# Patient Record
Sex: Male | Born: 1980 | Race: White | Hispanic: No | Marital: Single | Smoking: Current every day smoker
Health system: Southern US, Community
[De-identification: ages and names within clinical notes are randomized; demographics above are authoritative.]

## PROBLEM LIST (undated history)

## (undated) DIAGNOSIS — F4310 Post-traumatic stress disorder, unspecified: Secondary | ICD-10-CM

## (undated) DIAGNOSIS — I10 Essential (primary) hypertension: Secondary | ICD-10-CM

## (undated) DIAGNOSIS — F319 Bipolar disorder, unspecified: Secondary | ICD-10-CM

## (undated) HISTORY — PX: APPENDECTOMY: SHX54

## (undated) HISTORY — PX: HEEL SPUR SURGERY: SHX665

## (undated) HISTORY — PX: FOOT SURGERY: SHX648

---
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
## 2002-01-03 ENCOUNTER — Encounter: Payer: Self-pay | Admitting: Emergency Medicine

## 2002-01-03 ENCOUNTER — Emergency Department (HOSPITAL_COMMUNITY): Admission: EM | Admit: 2002-01-03 | Discharge: 2002-01-03 | Payer: Self-pay | Admitting: Emergency Medicine

## 2002-01-19 ENCOUNTER — Emergency Department (HOSPITAL_COMMUNITY): Admission: EM | Admit: 2002-01-19 | Discharge: 2002-01-19 | Payer: Self-pay | Admitting: Emergency Medicine

## 2002-01-19 ENCOUNTER — Encounter: Payer: Self-pay | Admitting: Emergency Medicine

## 2002-02-17 ENCOUNTER — Inpatient Hospital Stay (HOSPITAL_COMMUNITY): Admission: EM | Admit: 2002-02-17 | Discharge: 2002-02-21 | Payer: Self-pay | Admitting: Psychiatry

## 2005-03-05 ENCOUNTER — Emergency Department (HOSPITAL_COMMUNITY): Admission: EM | Admit: 2005-03-05 | Discharge: 2005-03-05 | Payer: Self-pay | Admitting: Emergency Medicine

## 2005-10-21 ENCOUNTER — Emergency Department (HOSPITAL_COMMUNITY): Admission: EM | Admit: 2005-10-21 | Discharge: 2005-10-21 | Payer: Self-pay | Admitting: Emergency Medicine

## 2006-04-20 ENCOUNTER — Emergency Department (HOSPITAL_COMMUNITY): Admission: EM | Admit: 2006-04-20 | Discharge: 2006-04-20 | Payer: Self-pay | Admitting: Emergency Medicine

## 2006-07-31 ENCOUNTER — Emergency Department (HOSPITAL_COMMUNITY): Admission: EM | Admit: 2006-07-31 | Discharge: 2006-07-31 | Payer: Self-pay | Admitting: Emergency Medicine

## 2006-07-31 ENCOUNTER — Inpatient Hospital Stay (HOSPITAL_COMMUNITY): Admission: AD | Admit: 2006-07-31 | Discharge: 2006-08-02 | Payer: Self-pay | Admitting: Psychiatry

## 2006-07-31 ENCOUNTER — Ambulatory Visit: Payer: Self-pay | Admitting: Psychiatry

## 2006-09-03 ENCOUNTER — Emergency Department (HOSPITAL_COMMUNITY): Admission: EM | Admit: 2006-09-03 | Discharge: 2006-09-03 | Payer: Self-pay | Admitting: Family Medicine

## 2006-09-25 ENCOUNTER — Emergency Department (HOSPITAL_COMMUNITY): Admission: EM | Admit: 2006-09-25 | Discharge: 2006-09-25 | Payer: Self-pay | Admitting: Emergency Medicine

## 2007-04-04 ENCOUNTER — Inpatient Hospital Stay (HOSPITAL_COMMUNITY): Admission: EM | Admit: 2007-04-04 | Discharge: 2007-04-09 | Payer: Self-pay | Admitting: Psychiatry

## 2007-04-04 ENCOUNTER — Emergency Department (HOSPITAL_COMMUNITY): Admission: EM | Admit: 2007-04-04 | Discharge: 2007-04-04 | Payer: Self-pay | Admitting: Emergency Medicine

## 2007-04-04 ENCOUNTER — Ambulatory Visit: Payer: Self-pay | Admitting: Psychiatry

## 2007-07-08 ENCOUNTER — Emergency Department (HOSPITAL_COMMUNITY): Admission: EM | Admit: 2007-07-08 | Discharge: 2007-07-08 | Payer: Self-pay | Admitting: Emergency Medicine

## 2007-10-27 ENCOUNTER — Emergency Department (HOSPITAL_COMMUNITY): Admission: EM | Admit: 2007-10-27 | Discharge: 2007-10-27 | Payer: Self-pay | Admitting: Emergency Medicine

## 2008-08-22 ENCOUNTER — Emergency Department (HOSPITAL_COMMUNITY): Admission: EM | Admit: 2008-08-22 | Discharge: 2008-08-22 | Payer: Self-pay | Admitting: *Deleted

## 2008-09-01 ENCOUNTER — Emergency Department (HOSPITAL_COMMUNITY): Admission: EM | Admit: 2008-09-01 | Discharge: 2008-09-01 | Payer: Self-pay | Admitting: Emergency Medicine

## 2008-12-15 ENCOUNTER — Emergency Department (HOSPITAL_COMMUNITY): Admission: EM | Admit: 2008-12-15 | Discharge: 2008-12-15 | Payer: Self-pay | Admitting: Emergency Medicine

## 2009-05-30 ENCOUNTER — Emergency Department (HOSPITAL_COMMUNITY): Admission: EM | Admit: 2009-05-30 | Discharge: 2009-05-30 | Payer: Self-pay | Admitting: Emergency Medicine

## 2010-04-09 ENCOUNTER — Emergency Department (HOSPITAL_COMMUNITY): Admission: EM | Admit: 2010-04-09 | Discharge: 2010-04-09 | Payer: Self-pay | Admitting: Family Medicine

## 2010-04-26 ENCOUNTER — Emergency Department (HOSPITAL_COMMUNITY): Admission: EM | Admit: 2010-04-26 | Discharge: 2010-04-26 | Payer: Self-pay | Admitting: Emergency Medicine

## 2010-05-18 ENCOUNTER — Encounter: Admission: RE | Admit: 2010-05-18 | Discharge: 2010-05-18 | Payer: Self-pay | Admitting: Internal Medicine

## 2010-05-28 ENCOUNTER — Emergency Department (HOSPITAL_COMMUNITY): Admission: EM | Admit: 2010-05-28 | Discharge: 2010-05-29 | Payer: Self-pay | Admitting: Emergency Medicine

## 2010-06-05 ENCOUNTER — Emergency Department (HOSPITAL_COMMUNITY): Admission: EM | Admit: 2010-06-05 | Discharge: 2010-06-05 | Payer: Self-pay | Admitting: Emergency Medicine

## 2010-06-14 ENCOUNTER — Emergency Department (HOSPITAL_COMMUNITY): Admission: EM | Admit: 2010-06-14 | Discharge: 2010-06-14 | Payer: Self-pay | Admitting: Emergency Medicine

## 2010-06-17 ENCOUNTER — Emergency Department (HOSPITAL_COMMUNITY): Admission: EM | Admit: 2010-06-17 | Discharge: 2010-06-17 | Payer: Self-pay | Admitting: Emergency Medicine

## 2010-06-25 ENCOUNTER — Emergency Department (HOSPITAL_COMMUNITY): Admission: EM | Admit: 2010-06-25 | Discharge: 2010-06-25 | Payer: Self-pay | Admitting: Emergency Medicine

## 2010-06-28 ENCOUNTER — Emergency Department (HOSPITAL_COMMUNITY)
Admission: EM | Admit: 2010-06-28 | Discharge: 2010-06-28 | Payer: Self-pay | Source: Home / Self Care | Admitting: Emergency Medicine

## 2010-06-29 ENCOUNTER — Encounter
Admission: RE | Admit: 2010-06-29 | Discharge: 2010-07-19 | Payer: Self-pay | Source: Home / Self Care | Attending: Orthopedic Surgery | Admitting: Orthopedic Surgery

## 2010-07-01 ENCOUNTER — Emergency Department (HOSPITAL_COMMUNITY)
Admission: EM | Admit: 2010-07-01 | Discharge: 2010-07-01 | Payer: Self-pay | Source: Home / Self Care | Admitting: Emergency Medicine

## 2010-07-14 ENCOUNTER — Emergency Department (HOSPITAL_COMMUNITY)
Admission: EM | Admit: 2010-07-14 | Discharge: 2010-07-14 | Payer: Self-pay | Source: Home / Self Care | Admitting: Emergency Medicine

## 2010-07-17 ENCOUNTER — Ambulatory Visit (HOSPITAL_COMMUNITY)
Admission: RE | Admit: 2010-07-17 | Discharge: 2010-07-17 | Payer: Self-pay | Source: Home / Self Care | Attending: Orthopedic Surgery | Admitting: Orthopedic Surgery

## 2010-07-26 ENCOUNTER — Encounter: Admission: RE | Admit: 2010-07-26 | Payer: Self-pay | Source: Home / Self Care | Admitting: Orthopedic Surgery

## 2010-08-19 ENCOUNTER — Emergency Department (HOSPITAL_COMMUNITY)
Admission: EM | Admit: 2010-08-19 | Discharge: 2010-08-19 | Payer: Self-pay | Source: Home / Self Care | Admitting: Emergency Medicine

## 2010-09-20 ENCOUNTER — Emergency Department (HOSPITAL_COMMUNITY): Payer: Self-pay

## 2010-09-20 ENCOUNTER — Emergency Department (HOSPITAL_COMMUNITY)
Admission: EM | Admit: 2010-09-20 | Discharge: 2010-09-20 | Disposition: A | Discharge status: Home / Self Care | Payer: Self-pay | Attending: Emergency Medicine | Admitting: Emergency Medicine

## 2010-09-20 DIAGNOSIS — M5126 Other intervertebral disc displacement, lumbar region: Secondary | ICD-10-CM | POA: Insufficient documentation

## 2010-09-20 DIAGNOSIS — M549 Dorsalgia, unspecified: Secondary | ICD-10-CM | POA: Insufficient documentation

## 2010-09-24 ENCOUNTER — Emergency Department (HOSPITAL_COMMUNITY)
Admission: EM | Admit: 2010-09-24 | Discharge: 2010-09-24 | Disposition: A | Discharge status: Home / Self Care | Payer: Self-pay | Attending: Emergency Medicine | Admitting: Emergency Medicine

## 2010-09-24 DIAGNOSIS — IMO0002 Reserved for concepts with insufficient information to code with codable children: Secondary | ICD-10-CM | POA: Insufficient documentation

## 2010-09-27 ENCOUNTER — Other Ambulatory Visit (HOSPITAL_COMMUNITY): Payer: Self-pay | Admitting: Orthopedic Surgery

## 2010-09-27 DIAGNOSIS — M545 Low back pain: Secondary | ICD-10-CM

## 2010-09-27 DIAGNOSIS — M5126 Other intervertebral disc displacement, lumbar region: Secondary | ICD-10-CM

## 2010-10-02 ENCOUNTER — Ambulatory Visit (HOSPITAL_COMMUNITY)
Admission: RE | Admit: 2010-10-02 | Discharge: 2010-10-02 | Disposition: A | Discharge status: Home / Self Care | Payer: Self-pay | Source: Ambulatory Visit | Attending: Orthopedic Surgery | Admitting: Orthopedic Surgery

## 2010-10-02 DIAGNOSIS — M5126 Other intervertebral disc displacement, lumbar region: Secondary | ICD-10-CM

## 2010-10-02 DIAGNOSIS — X500XXA Overexertion from strenuous movement or load, initial encounter: Secondary | ICD-10-CM | POA: Insufficient documentation

## 2010-10-02 DIAGNOSIS — M545 Low back pain, unspecified: Secondary | ICD-10-CM | POA: Insufficient documentation

## 2010-10-12 LAB — DIFFERENTIAL
Basophils Relative: 0 % (ref 0–1)
Eosinophils Absolute: 0 10*3/uL (ref 0.0–0.7)
Monocytes Relative: 5 % (ref 3–12)
Neutro Abs: 12.8 10*3/uL — ABNORMAL HIGH (ref 1.7–7.7)
Neutrophils Relative %: 77 % (ref 43–77)

## 2010-10-12 LAB — CBC
Hemoglobin: 18.8 g/dL — ABNORMAL HIGH (ref 13.0–17.0)
MCHC: 35.2 g/dL (ref 30.0–36.0)
Platelets: 228 10*3/uL (ref 150–400)
RBC: 6.02 MIL/uL — ABNORMAL HIGH (ref 4.22–5.81)

## 2010-10-12 LAB — POCT INFECTIOUS MONO SCREEN: Mono Screen: NEGATIVE

## 2010-10-18 ENCOUNTER — Emergency Department (HOSPITAL_COMMUNITY)
Admission: EM | Admit: 2010-10-18 | Discharge: 2010-10-18 | Discharge status: Against Medical Advice | Payer: Self-pay | Attending: Emergency Medicine | Admitting: Emergency Medicine

## 2010-10-18 DIAGNOSIS — G8929 Other chronic pain: Secondary | ICD-10-CM | POA: Insufficient documentation

## 2010-10-18 DIAGNOSIS — M549 Dorsalgia, unspecified: Secondary | ICD-10-CM | POA: Insufficient documentation

## 2010-10-20 ENCOUNTER — Emergency Department (HOSPITAL_COMMUNITY)
Admission: EM | Admit: 2010-10-20 | Discharge: 2010-10-20 | Disposition: A | Discharge status: Home / Self Care | Payer: Self-pay | Attending: Emergency Medicine | Admitting: Emergency Medicine

## 2010-10-20 DIAGNOSIS — Y92009 Unspecified place in unspecified non-institutional (private) residence as the place of occurrence of the external cause: Secondary | ICD-10-CM | POA: Insufficient documentation

## 2010-10-20 DIAGNOSIS — G8929 Other chronic pain: Secondary | ICD-10-CM | POA: Insufficient documentation

## 2010-10-20 DIAGNOSIS — W260XXA Contact with knife, initial encounter: Secondary | ICD-10-CM | POA: Insufficient documentation

## 2010-10-20 DIAGNOSIS — S61509A Unspecified open wound of unspecified wrist, initial encounter: Secondary | ICD-10-CM | POA: Insufficient documentation

## 2010-10-20 DIAGNOSIS — W01119A Fall on same level from slipping, tripping and stumbling with subsequent striking against unspecified sharp object, initial encounter: Secondary | ICD-10-CM | POA: Insufficient documentation

## 2010-10-20 DIAGNOSIS — M549 Dorsalgia, unspecified: Secondary | ICD-10-CM | POA: Insufficient documentation

## 2010-11-04 ENCOUNTER — Emergency Department (HOSPITAL_COMMUNITY)
Admission: EM | Admit: 2010-11-04 | Discharge: 2010-11-05 | Disposition: A | Discharge status: Home / Self Care | Payer: Self-pay | Attending: Emergency Medicine | Admitting: Emergency Medicine

## 2010-11-04 DIAGNOSIS — M79609 Pain in unspecified limb: Secondary | ICD-10-CM | POA: Insufficient documentation

## 2010-11-04 DIAGNOSIS — R32 Unspecified urinary incontinence: Secondary | ICD-10-CM | POA: Insufficient documentation

## 2010-11-04 DIAGNOSIS — R296 Repeated falls: Secondary | ICD-10-CM | POA: Insufficient documentation

## 2010-11-04 DIAGNOSIS — M549 Dorsalgia, unspecified: Secondary | ICD-10-CM | POA: Insufficient documentation

## 2010-12-12 NOTE — H&P (Signed)
NAMEEDIE, DARLEY NO.:  [id number]   MEDICAL RECORD NO.:  [id number]          PATIENT TYPE:  IPS   LOCATION:  0505                          FACILITY:  [facility]   PHYSICIAN:  Anselm Jungling, MD  DATE OF BIRTH:  1980-10-23   DATE OF ADMISSION:  04/04/2007  DATE OF DISCHARGE:                       PSYCHIATRIC ADMISSION ASSESSMENT   REASON FOR ADMISSION:  This is a voluntary admission to the services of  Dr. Geralyn Flash.   IDENTIFYING INFORMATION:  This is a 30 year old divorced white male.  He  presented to the [facility] emergency department yesterday with a chief  complaint of suicidal ideation.  He reports having frequent flashbacks  and nightmares.  He can no longer control these.  He states that he has  been eating pills and drinking alcohol, yes to get high but also to try  to get away from the flashbacks and the nightmares.  He is an [employer]  veteran.  He was in service from 2003 to 2005 in [place].  Unfortunately he  [facility] not [facility] or to get a case  manager.  Yesterday he was also inflicting superficial lacerations on  his left wrist and last night had a flashback and had to make sure that  the person next to him was not the enemy.  He was last here as an  inpatient in January; he was here July 31, 2006, to August 02, 2006.  At that time he was felt to have a history for mood lability and a  diagnosis of bipolar.  He did acknowledge that he had stopped his  Zyprexa and Lithium prior to joining the [employer].  He was discharged at  that time with a diagnosis of benzodiazepine, alcohol and marijuana  abuse, rule out dependence; mood disorder, NOS.  He was discharged on  Depakote ER 500 three at bedtime, Zyprexa 5 at bedtime and Librium to  complete his detoxification.  He was to be followed up at [facility].  Unfortunately, as he was unemployed he never had  his prescriptions filled.   PAST  PSYCHIATRIC HISTORY:  Began around age 41.  He was admitted to  [facility], given a diagnosis of bipolar.  He states however that Lithium  does give him the shakes and he has not taken it.  From age 63 until  approximately the end of high school he was on Lithium, Zyprexa,  Adderall and Remeron.  At the time he joined the [employer] at age 74 he was  on no medication.  He began medication again in 2007.  He states that  after he had the left heel shot off he was seen in [place] by a  psychiatrist.   SOCIAL HISTORY:  He has 1 year of college.  He has been married and  divorced once.  He has no children.  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  He  states he does not have any income to declare for income tax purposes.  FAMILY HISTORY:  His mother tried to commit suicide when he was younger.  She takes Xanax.  He is estranged from his biological father and  siblings.   ALCOHOL AND DRUG HISTORY:  He drinks and drugs to control the flashbacks  and nightmares.  He uses as much as 15 mg of Xanax per day.   PHYSICIANS:  Primary care Nery Frappier:  He does not have one.  Psychiatrist:  He does not have one.   PAST MEDICAL HISTORY:  Medical problems:  None are known.   MEDICATIONS:  None are prescribed at present.   DRUG ALLERGIES:  HE STATES THAT HYDROCODONE AND PAIN PILLS, SPECIFICALLY  CODEINE, GIVE HIM A RASH AND GIVE HIM NAUSEA AND VOMITING.   LABORATORY DATA:  His UDS was positive for opiates, benzos and  marijuana.  He states he does not know where the opiates came from.  It  may just have been laced in the marijuana.  His labs were unremarkable.  His alcohol level was only 11.   PHYSICAL EXAMINATION:  VITAL SIGNS:  On admission to the unit show that  he is 63 inches tall, he weighs 200 pounds.  Temperature was 96.9.  Blood pressure 142/97.  Pulse is 66.  Respirations are 18.  SKIN:  Please see anatomic drawing for positioning of  tattoos.  He does  have a scar on his posterior left heel from where he stepped on an IED.  He is also status post appendectomy in 2001.   MENTAL STATUS EXAMINATION:  Today he is alert and oriented.  He is  appropriately, albeit casually, groomed and dressed, adequately  nourished.  His speech is a normal rate, rhythm and tone.  His mood is  depressed and anxious.  His affect is congruent.  He had a flashback  this morning.  He is quite concerned about that.  Thought processes are  clear, rational, goal-oriented.  He is willing to get help.  Judgment  and insight are good.  Concentration and memory are good.  Intelligence  is at least average.  He denies being actively suicidal or homicidal.  He denies auditory or visual hallucinations, per se, although he does  have flashbacks and nightmares.   DIAGNOSES:  AXIS I:  Post-traumatic stress disorder.  Bipolar by  history.  Polysubstance abuse, rule out dependence.  AXIS II:  Deferred.  AXIS III:  Healed left heel injury from having stepped on an IED.  AXIS IV:  Issues with primary support group, occupational, housing,  economic issues.  He does have an upcoming court date April 08, 2007,  for misdemeanor larceny, seat belt issues and driving on an revoked  license.  AXIS V:  30.   PLAN:  1. He is here on sponsorship.  We will contact the [facility] and get him into      [place] care as soon as possible.  2. As he has had success with Zyprexa in the past, will restart some      Zyprexa as we can also give him samples of that until he can get      into [place] care.   ESTIMATED LENGTH OF STAY:  Three to 4 days.      Mickie Leonarda Salon, P.A.-C.      Anselm Jungling, MD  Electronically Signed    MD/MEDQ  D:  04/05/2007  T:  04/05/2007  Job:  [id number]

## 2010-12-15 NOTE — Op Note (Signed)
[facility]  Patient:    Ernest Escobar, Ernest Escobar Visit Number: [id number] MRN: [id number]          Service Type: SUR Location: 4W 0442 01 Attending Physician:  Ernest Escobar Dictated by:   Sheppard Plumber Earlene Plater, M.D. Proc. Date: 10/16/01 Admit Date:  10/16/2001                             Operative Report  PREOPERATIVE DIAGNOSIS:  Probable appendicitis.  POSTOPERATIVE DIAGNOSES:  Possible appendicitis, probable enteritis.  OPERATION PERFORMED:  Laparoscopic appendectomy.  SURGEON:  Timothy E. Earlene Plater, M.D.  ASSISTANT:  Dr. Gilman Buttner.  ANESTHESIA:  General.  INDICATIONS FOR PROCEDURE:  Ernest Escobar was referred to the ER by [facility] this morning for possible appendicitis. He gives a 72 hour history of increasing illness including upper respiratory symptoms, some bronchitis for which he was treated Monday and Tuesday, mild abdominal pain progressed to moderate abdominal pain today. This localized in the right lower quadrant. He has slight nausea, no vomiting. He did tolerate solid food within the past 24 hours. He has had loose stools. Temperature has been normal. He has been on antibiotics for 48 hours for the upper respiratory symptoms. His white count is elevated to 11.3. He shows involuntary guarding and tenderness in the right lower quadrant and he and his family were advised to undergo appendectomy and they agreed.  DESCRIPTION OF PROCEDURE:  The patient was brought to the operating room, placed supine, general endotracheal anesthesia administered. The abdomen was shaved, scrubbed, prepped and draped in the usual fashion. Marcaine 0.25% with epinephrine was used prior to each incision. A vertical incision made, infraumbilical midline fascia identified, fascia opened, the peritoneum entered without complication. The Hasson catheter placed, tied in place with #1 Vicryl. Peritoneoscopy was carried out showing some hyperemia of the  distal small bowel omentum overlying the right lower quadrant. A 5 mm trocar was placed in the right upper quadrant and then as the patient was positioned, the omentum was pulled back. The appendix appeared to be in its normal position and grossly normal. The small bowel was run and there was no evidence of Meckels and no real evidence of inflammatory bowel disease. I did think there was some hyperemia, very mild, of the distal small bowel. I saw no other pathology. The mesentery of the appendix was dissected. It was clamped across with an endoGIA staple device. There was a bit of oozing and three staples were applied over the staple line. The bleeding was controlled. There was no hematoma. The base of the appendix was then dissected and cut across with the endoGIA staple device. The appendix was placed in an endocatch bag. Copious irrigation was carried out. Again inspection of the lower abdomen revealed no other findings. The appendix was removed in the endobag through the left lower quadrant 12 mm trocar which had been previously placed. Inspection of that port site and the 5 mm port site showed no evidence of complications. All irrigant, CO2, instruments and trocars removed under direct vision. The infraumbilical incision was closed with a #1 Vicryl. The skin incision was closed with 3-0 monocryl. Counts correct. Steri-Strips and dry sterile dressing applied. Again he tolerated it well and was removed to the recovery room in good condition. Dictated by:   Sheppard Plumber Earlene Plater, M.D. Attending Physician:  Ernest Escobar DD:  10/16/01 TD:  10/17/01 Job: [id number]

## 2010-12-15 NOTE — Discharge Summary (Signed)
NAME:  Ernest Escobar, TEIXEIRA                       ACCOUNT NO.:  [id number]   MEDICAL RECORD NO.:  [id number]                   PATIENT TYPE:  IPS   LOCATION:  0501                                 FACILITY:  BH   PHYSICIAN:  Jeanice Lim, MD                DATE OF BIRTH:  Sep 24, 1980   DATE OF ADMISSION:  02/17/2002  DATE OF DISCHARGE:  02/21/2002                                 DISCHARGE SUMMARY   IDENTIFYING DATA:  This is a 30 year old single Caucasian male voluntarily  admitted with a history of depression and suicide attempt.  Cut both wrists  on two different dates with a pocketknife to relieve his emotional pain.   MEDICATIONS:  None.  The patient had been on Remeron, Zyprexa and lithium in  the past.   ALLERGIES:  No known drug allergies.   PHYSICAL EXAMINATION:  Within normal limits except for superficial  lacerations, multiple on wrists.  Neurologically nonfocal.   LABORATORY DATA:  Urine drug screen positive for benzodiazepines and  cannabis.  Alcohol level less than 5.   MENTAL STATUS EXAM:  Alert, casually dressed, cooperative male.  Fair eye  contact.  Speech clear.  Mood depressed and anxious.  Affect irritable.  Thought process goal directed.  Thought content negative for dangerous  ideation or psychotic symptoms.  Cognitively intact.  Judgment and insight  fair with a poor impulse control.   ADMISSION DIAGNOSES:   AXIS I:  1. Major depression.  2. Polysubstance abuse.   AXIS II:  None.   AXIS III:  None.   AXIS IV:  Moderate (problems with primary support group).   AXIS V:  30/75.   HOSPITAL COURSE:  The patient was admitted and ordered routine p.r.n.  medications and underwent further monitoring, participated in individual,  group and milieu therapy.  The patient was titrated on Seroquel initially  and then started on Trileptal to stabilize mood.  The patient tolerated  Trileptal well, reporting a positive response.   CONDITION ON DISCHARGE:   Improved.  Mood was more stable.  Affect brighter.  Thought processes goal directed.  Thought content negative for dangerous  ideation and psychotic symptoms.  The patient reported motivation to be  compliant with the follow-up plan.   DISCHARGE MEDICATIONS:  1. Trileptal 150 mg q.a.m. and 2 q.h.s.  2. Ambien 10 mg q.h.s. p.r.n. insomnia.   FOLLOW UP:  [facility] on Tuesday, February 24, 2002 and avoid substances of  abuse.   DISCHARGE DIAGNOSES:   AXIS I:  1. Major depression.  2. Polysubstance abuse.   AXIS II:  None.   AXIS III:  None.   AXIS IV:  Moderate (problems with primary support group).   AXIS V:  Global Assessment of Functioning on discharge 55.  Jeanice Lim, MD    JEM/MEDQ  D:  03/25/2002  T:  03/27/2002  Job:  [id number]

## 2010-12-15 NOTE — Discharge Summary (Signed)
NAME:  Ernest Escobar, Ernest Escobar NO.:  [id number]   MEDICAL RECORD NO.:  [id number]          PATIENT TYPE:  IPS   LOCATION:  0505                          FACILITY:  [facility]   PHYSICIAN:  Geoffery Lyons, M.D.      DATE OF BIRTH:  1981-02-28   DATE OF ADMISSION:  04/04/2007  DATE OF DISCHARGE:  04/09/2007                               DISCHARGE SUMMARY   CHIEF COMPLAINT/ HISTORY OF PRESENT ILLNESS:  This was the second  admission to [facility] for this 30 year old, divorced,  white male.  He presented to [facility] Emergency Department feeling  suicidal and having frequent flashbacks and nightmares, can no longer  control it.  He has been eating pills and drinking alcohol to get high  but also to try to get away from the flashbacks and the nightmares.  He  is [employer].  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  The day before  this admission, he inflicted superficial laceration on his left wrist.  The night previous to this evaluation, he had a flashback and had to  make sure that the person next to him was not the enemy.  He has been  abusing benzodiazepines, alcohol and marijuana.   PAST PSYCHIATRIC HISTORY:  He was inpatient in January 2008.  At that  time, history of mood lability and diagnosed bipolar.  He stopped his  Zyprexa and his lithium prior to joining the [employer].   MEDICAL HISTORY:  Noncontributory.   MEDICATIONS:  None at this particular time.   LABORATORY WORKUP:  White blood cells 11.4, hemoglobin 16.5,  Sodium  140, potassium 3.7.  Glucose 77.  SGOT 20, SGPT 26.  Total bilirubin  0.7.  TSH 1.371.   PHYSICAL EXAMINATION:  Reveals alert, cooperative male.  Speech was  normal in rate, rhythm and tone.  Mood was anxious, depressed.  Affect  was anxious, depressed.  Endorsed a flashback this morning, quite  concerned about that.  Thought processes are clear, rational and goal  oriented.  Wanting to get help.  No active suicidal or homicidal  ideas.  No delusions.  No hallucinations.  Cognition well-preserved.   ADMITTING DIAGNOSES:  AXIS I:  Post-traumatic stress disorder.  Mood  disorder, NOS.  Marijuana and alcohol abuse, rule out dependence.  AXIS II:  No diagnosis.  AXIS III:  Left heel injury.  AXIS IV:  Moderate.  AXIS V:  GAF on admission 38.  Highest GAF in last year 60.   COURSE IN THE HOSPITAL:  He was admitted, started individual and group  psychotherapy.  We used Librium for detox.  He was placed back on the  Zyprexa that he felt was beneficial for him in the past with no overt  side effects.  As already stated, he was admitted with flashbacks and  nightmares.  Endorsed that he closes his eyes and sees people's faces,  people are killed, people get shot.  He claimed that he uses whatever he  finds to deal with the symptoms -- marijuana every day, a lot, alcohol.  Endorsed that he is triggered by loud  noises.  He uses Xanax up to 10 mg  per day, can take up to 5 in one sitting.  He cannot find a job.  Very  overwhelmed.   Past Psychiatric History:  [facility], as already  stated.  He was in __________  for 3-1/2 weeks.  There he was given  trazodone, Remeron, Minipress.  He has been on lithium, Zyprexa,  Adderall.  Living with his mother.  He was diagnosed bipolar when he was  16.  He was depressed, withdrawn, isolated, hyperthymic, racing  thoughts, pressured speech, __________ , irritability, anger, violent  outbursts.   September 7, he was still endorsing flashbacks, nightmares, feeling  overwhelmed.  Trazodone was sedating, so he was placed on Remeron.  Trying to get himself back together.   September 9, he had slept better, still endorsing flashback, endorsed  cravings, as he used to deal with the flashbacks by using drugs.  Willing to pursue medication this time around.  He was given information  to get in touch with the [place].   September 10, he was in full contact with reality.  There  were no active  suicidal or homicidal ideas.  No hallucinations.  No delusions.  He was  feeling better but clear that he needed to pursue outpatient treatment.  No suicidal or homicidal ideation.   DISCHARGE DIAGNOSES:  AXIS I:  1.  Posttraumatic stress disorder.  1. Mood disorder, NOS.  2. Alcohol, marijuana, benzodiazepine abuse.  AXIS II:  No diagnosis.  AXIS III:  Left heel injury.  AXIS IV:  Moderate.  AXIS V:  GAF on discharge 50-55.   DISCHARGE MEDICATIONS:  1. Zyprexa Zydis 15 at night.  2. Remeron 30 mg at bedtime.   FOLLOWUP:  Dr. Lang Snow and the [place].      Geoffery Lyons, M.D.  Electronically Signed     IL/MEDQ  D:  05/05/2007  T:  05/06/2007  Job:  [id number]

## 2010-12-15 NOTE — H&P (Signed)
[facility]  Patient:    Ernest Escobar, Ernest Escobar Visit Number: [id number] MRN: [id number]          Service Type: PSY Location: 500 0501 02 Attending Physician:  Jeanice Lim Dictated by:   Candi Leash. Orsini, N.P. Admit Date:  02/17/2002 Discharge Date: 02/21/2002                     Psychiatric Admission Assessment  IDENTIFYING INFORMATION:  This 30 year old single white male was voluntarily admitted on February 17, 2002.  HISTORY OF PRESENT ILLNESS:  The patient presents with a history of depression and suicide attempt.  He cut both wrists on two different dates, July 21 and July 22, with a pocketknife.  He states he did it to "relieve emotional pain." He did it by himself and then went to the emergency department for help.  He has a history of depression prior to this event for the past couple of months. He states when he did it he was having some conflicts with his girlfriend.  He reports that he still remains depressed.  Denied any suicidal thoughts at present.  His sleep has been increased.  He goes to bed when he comes home from work.  He reports a decreased appetite.  He has lost 30 pounds in the past two weeks.  PAST PSYCHIATRIC HISTORY:  First visit to [facility].  No other hospitalizations.  No outpatient treatment.  No history of a suicide attempt. Was at [facility] when he was 30 years of age and has been diagnosed with bipolar disorder.  SOCIAL HISTORY:  This is a 30 year old single white male.  He has no children. He lives with his mother.  He works as a Agricultural engineer.  He has completed one year of college.  No legal problems.  FAMILY HISTORY:  Mother attempted suicide years ago.  ALCOHOL/DRUG HISTORY:  The patient states he has been alcohol and marijuana-free for one month.  PRIMARY CARE Dewain Platz:  None.  MEDICAL PROBLEMS:  None.  MEDICATIONS:  None.  The patient has been on Remeron, Zyprexa and lithium in the  past.  DRUG ALLERGIES:  None.  PHYSICAL EXAMINATION:  Performed at [facility] Emergency Department. Multiple superficial lacerations.  States he has had a recent tetanus injection.  LABORATORY DATA:  Urine drug screen was positive for benzodiazepines, positive for THC.  Alcohol level was less than 5.  MENTAL STATUS EXAMINATION:  He is an alert, casually dressed, cooperative male.  Fair eye contact.  Speech is clear and relevant.  Mood is depressed and anxious.  Affect is anxious.  Thought processes are coherent.  No evidence of psychosis.  No auditory or visual hallucinations.  No suicidal or homicidal ideation.  Cognitive function intact.  Memory is good.  Judgment is poor. Insight is fair.  Poor impulse control.  DIAGNOSES: Axis I:    1. Major depression.            2. Polysubstance abuse.            3. Rule out bipolar disorder. Axis II:   Deferred. Axis III:  None. Axis IV:   Problems with primary support group, other psychosocial problems. Axis V:    Current 30; this past year 50.  PLAN:  Voluntary admission for depression and self-inflicted injury.  Contract for safety.  Check every 15 minutes.  Will obtain labs.  Will initiate Seroquel for anxiety.  Have a family session prior to discharge.  TENTATIVE  LENGTH OF STAY:  Three to five days. Dictated by:   Candi Leash. Orsini, N.P. Attending Physician:  Jeanice Lim DD:  02/19/02 TD:  02/21/02 Job: [id number]
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
## 2011-01-24 ENCOUNTER — Emergency Department (HOSPITAL_COMMUNITY): Payer: Self-pay

## 2011-01-24 ENCOUNTER — Emergency Department (HOSPITAL_COMMUNITY)
Admission: EM | Admit: 2011-01-24 | Discharge: 2011-01-24 | Disposition: A | Discharge status: Home / Self Care | Payer: Self-pay | Attending: Emergency Medicine | Admitting: Emergency Medicine

## 2011-01-24 DIAGNOSIS — M79609 Pain in unspecified limb: Secondary | ICD-10-CM | POA: Insufficient documentation

## 2011-01-24 DIAGNOSIS — S93609A Unspecified sprain of unspecified foot, initial encounter: Secondary | ICD-10-CM | POA: Insufficient documentation

## 2011-01-24 DIAGNOSIS — M7989 Other specified soft tissue disorders: Secondary | ICD-10-CM | POA: Insufficient documentation

## 2011-01-24 DIAGNOSIS — Y9361 Activity, american tackle football: Secondary | ICD-10-CM | POA: Insufficient documentation

## 2011-01-24 DIAGNOSIS — W219XXA Striking against or struck by unspecified sports equipment, initial encounter: Secondary | ICD-10-CM | POA: Insufficient documentation

## 2011-01-28 ENCOUNTER — Emergency Department (HOSPITAL_COMMUNITY): Payer: Self-pay

## 2011-01-28 ENCOUNTER — Emergency Department (HOSPITAL_COMMUNITY)
Admission: EM | Admit: 2011-01-28 | Discharge: 2011-01-28 | Disposition: A | Discharge status: Home / Self Care | Payer: Self-pay | Attending: Emergency Medicine | Admitting: Emergency Medicine

## 2011-01-28 DIAGNOSIS — M549 Dorsalgia, unspecified: Secondary | ICD-10-CM | POA: Insufficient documentation

## 2011-01-28 DIAGNOSIS — M79609 Pain in unspecified limb: Secondary | ICD-10-CM | POA: Insufficient documentation

## 2011-01-28 DIAGNOSIS — S60229A Contusion of unspecified hand, initial encounter: Secondary | ICD-10-CM | POA: Insufficient documentation

## 2011-01-28 DIAGNOSIS — IMO0002 Reserved for concepts with insufficient information to code with codable children: Secondary | ICD-10-CM | POA: Insufficient documentation

## 2011-01-28 DIAGNOSIS — W2209XA Striking against other stationary object, initial encounter: Secondary | ICD-10-CM | POA: Insufficient documentation

## 2011-01-28 DIAGNOSIS — G8929 Other chronic pain: Secondary | ICD-10-CM | POA: Insufficient documentation

## 2011-01-28 DIAGNOSIS — S6990XA Unspecified injury of unspecified wrist, hand and finger(s), initial encounter: Secondary | ICD-10-CM | POA: Insufficient documentation

## 2011-01-28 DIAGNOSIS — Y92009 Unspecified place in unspecified non-institutional (private) residence as the place of occurrence of the external cause: Secondary | ICD-10-CM | POA: Insufficient documentation

## 2011-01-28 DIAGNOSIS — M7989 Other specified soft tissue disorders: Secondary | ICD-10-CM | POA: Insufficient documentation

## 2011-02-09 ENCOUNTER — Encounter (HOSPITAL_COMMUNITY)
Admission: RE | Admit: 2011-02-09 | Discharge: 2011-02-09 | Disposition: A | Discharge status: Home / Self Care | Payer: Self-pay | Source: Ambulatory Visit | Attending: Orthopedic Surgery | Admitting: Orthopedic Surgery

## 2011-02-09 LAB — SURGICAL PCR SCREEN
MRSA, PCR: NEGATIVE
Staphylococcus aureus: NEGATIVE

## 2011-02-09 LAB — CBC
MCV: 88.9 fL (ref 78.0–100.0)
Platelets: 178 10*3/uL (ref 150–400)
RBC: 5.66 MIL/uL (ref 4.22–5.81)
RDW: 13.2 % (ref 11.5–15.5)
WBC: 8.3 10*3/uL (ref 4.0–10.5)

## 2011-02-12 ENCOUNTER — Ambulatory Visit (HOSPITAL_COMMUNITY)
Admission: RE | Admit: 2011-02-12 | Discharge: 2011-02-12 | Disposition: A | Discharge status: Home / Self Care | Payer: Self-pay | Source: Ambulatory Visit | Attending: Orthopedic Surgery | Admitting: Orthopedic Surgery

## 2011-02-12 DIAGNOSIS — K219 Gastro-esophageal reflux disease without esophagitis: Secondary | ICD-10-CM | POA: Insufficient documentation

## 2011-02-12 DIAGNOSIS — M129 Arthropathy, unspecified: Secondary | ICD-10-CM | POA: Insufficient documentation

## 2011-02-12 DIAGNOSIS — F17200 Nicotine dependence, unspecified, uncomplicated: Secondary | ICD-10-CM | POA: Insufficient documentation

## 2011-02-12 DIAGNOSIS — M7730 Calcaneal spur, unspecified foot: Secondary | ICD-10-CM | POA: Insufficient documentation

## 2011-02-12 DIAGNOSIS — M79609 Pain in unspecified limb: Secondary | ICD-10-CM | POA: Insufficient documentation

## 2011-02-14 NOTE — Op Note (Signed)
  NAME:  Ernest Escobar, Ernest Escobar NO.:  [id number]  MEDICAL RECORD NO.:  [id number]  LOCATION:  SDSC                         FACILITY:  [facility]  PHYSICIAN:  Harvie Junior, M.D.   DATE OF BIRTH:  [birth year]  DATE OF PROCEDURE:  02/12/2011 DATE OF DISCHARGE:  02/12/2011                              OPERATIVE REPORT   PREOPERATIVE DIAGNOSIS:  Painful plantar fascia, right foot with painful heel spur.  POSTOPERATIVE DIAGNOSIS:  Painful plantar fascia, right foot with painful heel spur.  PRINCIPAL PROCEDURE: 1. Endoscopic plantar fascia release. 2. Open excision of plantar heel spur by way of partial exostectomy of     the calcaneus.  SURGEON:  Harvie Junior, MD  ASSISTANT:  Marshia Ly, PA  ANESTHESIA:  General.  BRIEF HISTORY:  [name] with long history of having had severe right heel pain, had been treated conservatively for a period of time with activity modification, stretching for plantarfasciitis.  After failure of conservative care, we discussed option including injection therapy versus continued conservative care.  He wished to proceed with surgical intervention and he was brought to the operating room for this procedure.  PROCEDURE:  The patient was brought to the operating room.  After adequate anesthesia was obtained with general anesthetic, the patient was placed supine on the operating room table.  The right foot was then prepped and draped in the usual sterile fashion.  Following this, the leg was exsanguinated and blood pressure tourniquet inflated to 250 mmHg.  Following this, a small incision was made just in line with the posterior aspect of the medial malleolus at the joint of the thickened skin on the heel.  The slotted cannula was then placed through just inferior to the plantar fascia.  Once this was felt, it was directly visualized endoscopically and this was released with a triangle blade. Once that was completed,  a Windlass maneuver was performed which showed that plantar fascia had been released.  Once that was completed, the incision on medial side was extended and the dissection was carried down to the level of the plantar heel spur, was clearly identified and then removed on mass with an osteotome and then rongeured out.  Once this was done, the gloved finger could be placed across the wound.  There was no evidence of tightness of the plantar fascia at this point given the previous plantar fascia release.  At this point, the wound was copiously and thoroughly lavaged and suctioned dry.  The medial side was closed with interrupted nylon.  The lateral side with a single nylon interrupted.  Sterile compressive dressing was applied and the patient was taken to recovery room and was noted to be in satisfactory condition.  Estimated blood loss for the procedure was none.     Harvie Junior, M.D.     Ranae Plumber  D:  02/12/2011  T:  02/13/2011  Job:  [id number]  Electronically Signed by Jodi Geralds M.D. on 02/14/2011 09:09:04 PM

## 2011-05-07 LAB — DIFFERENTIAL
Basophils Relative: 2 — ABNORMAL HIGH
Lymphs Abs: 3.5
Monocytes Absolute: 0.9
Monocytes Relative: 7
Neutro Abs: 8.3 — ABNORMAL HIGH
Neutrophils Relative %: 64

## 2011-05-07 LAB — I-STAT 8, (EC8 V) (CONVERTED LAB)
BUN: 9
Bicarbonate: 25.4 — ABNORMAL HIGH
Glucose, Bld: 94
Sodium: 139
TCO2: 27
pCO2, Ven: 44.2 — ABNORMAL LOW
pH, Ven: 7.367 — ABNORMAL HIGH

## 2011-05-07 LAB — URINALYSIS, ROUTINE W REFLEX MICROSCOPIC
Bilirubin Urine: NEGATIVE
Glucose, UA: NEGATIVE
Hgb urine dipstick: NEGATIVE
Ketones, ur: 15 — AB
Protein, ur: NEGATIVE
Urobilinogen, UA: 0.2

## 2011-05-07 LAB — RAPID URINE DRUG SCREEN, HOSP PERFORMED
Amphetamines: NOT DETECTED
Barbiturates: NOT DETECTED
Benzodiazepines: POSITIVE — AB
Opiates: NOT DETECTED
Tetrahydrocannabinol: POSITIVE — AB

## 2011-05-07 LAB — CBC
Hemoglobin: 16.6
MCHC: 34.8
MCV: 87.9
RBC: 5.42
WBC: 13 — ABNORMAL HIGH

## 2011-05-07 LAB — ETHANOL: Alcohol, Ethyl (B): 82 — ABNORMAL HIGH

## 2011-05-07 LAB — POCT I-STAT CREATININE: Operator id: 294341

## 2011-05-11 LAB — DIFFERENTIAL
Basophils Absolute: 0
Eosinophils Relative: 2
Lymphocytes Relative: 36
Neutrophils Relative %: 57

## 2011-05-11 LAB — POCT I-STAT CREATININE: Operator id: 277751

## 2011-05-11 LAB — COMPREHENSIVE METABOLIC PANEL
CO2: 27
Calcium: 9.5
Creatinine, Ser: 1.16
GFR calc non Af Amer: >60
Glucose, Bld: 77

## 2011-05-11 LAB — I-STAT 8, (EC8 V) (CONVERTED LAB)
BUN: 8
Bicarbonate: 22.9
Glucose, Bld: 99
HCT: 52
Operator id: 277751
TCO2: 24
pCO2, Ven: 36.3 — ABNORMAL LOW

## 2011-05-11 LAB — CBC
HCT: 48.6
Hemoglobin: 16.5
MCHC: 34.3
MCV: 88.1
Platelets: 193
RBC: 5.46
RDW: 12.7
WBC: 9.3

## 2011-05-11 LAB — MAGNESIUM: Magnesium: 2.1

## 2011-05-11 LAB — RAPID URINE DRUG SCREEN, HOSP PERFORMED
Amphetamines: NOT DETECTED
Barbiturates: NOT DETECTED
Benzodiazepines: POSITIVE — AB
Tetrahydrocannabinol: POSITIVE — AB

## 2011-05-11 LAB — TSH: TSH: 1.371

## 2011-05-11 LAB — SALICYLATE LEVEL: Salicylate Lvl: <4

## 2011-05-11 LAB — ACETAMINOPHEN LEVEL: Acetaminophen (Tylenol), Serum: <10 — ABNORMAL LOW

## 2011-05-11 LAB — ETHANOL: Alcohol, Ethyl (B): 11 — ABNORMAL HIGH

## 2011-08-02 ENCOUNTER — Emergency Department (HOSPITAL_COMMUNITY)
Admission: EM | Admit: 2011-08-02 | Discharge: 2011-08-02 | Disposition: A | Discharge status: Home / Self Care | Payer: Self-pay | Attending: Emergency Medicine | Admitting: Emergency Medicine

## 2011-08-02 DIAGNOSIS — F4310 Post-traumatic stress disorder, unspecified: Secondary | ICD-10-CM | POA: Insufficient documentation

## 2011-08-02 DIAGNOSIS — R51 Headache: Secondary | ICD-10-CM | POA: Insufficient documentation

## 2011-08-02 DIAGNOSIS — I10 Essential (primary) hypertension: Secondary | ICD-10-CM | POA: Insufficient documentation

## 2011-08-02 DIAGNOSIS — F319 Bipolar disorder, unspecified: Secondary | ICD-10-CM | POA: Insufficient documentation

## 2011-08-02 HISTORY — DX: Bipolar disorder, unspecified: F31.9

## 2011-08-02 HISTORY — DX: Post-traumatic stress disorder, unspecified: F43.10

## 2011-08-02 LAB — DIFFERENTIAL
Eosinophils Absolute: 0.1 10*3/uL (ref 0.0–0.7)
Lymphocytes Relative: 34 % (ref 12–46)
Lymphs Abs: 3.2 10*3/uL (ref 0.7–4.0)
Neutro Abs: 5.5 10*3/uL (ref 1.7–7.7)
Neutrophils Relative %: 59 % (ref 43–77)

## 2011-08-02 LAB — COMPREHENSIVE METABOLIC PANEL
ALT: 35 U/L (ref 0–53)
Alkaline Phosphatase: 70 U/L (ref 39–117)
GFR calc Af Amer: >90 mL/min (ref >90)
Glucose, Bld: 83 mg/dL (ref 70–99)
Potassium: 4 mEq/L (ref 3.5–5.1)
Sodium: 137 mEq/L (ref 135–145)
Total Protein: 7.6 g/dL (ref 6.0–8.3)

## 2011-08-02 LAB — CBC
Platelets: 216 10*3/uL (ref 150–400)
RBC: 5.71 MIL/uL (ref 4.22–5.81)
WBC: 9.4 10*3/uL (ref 4.0–10.5)

## 2011-08-02 NOTE — ED Notes (Signed)
Pt resting quietly with eyes closed, resp equal and nonlabored.  Easily roused from sleep.

## 2011-08-02 NOTE — ED Provider Notes (Signed)
History     CSN: [id number]  Arrival date & time 08/02/11  1318   First MD Initiated Contact with Patient 08/02/11 1727      Chief Complaint  Patient presents with  . Hypertension    (Consider location/radiation/quality/duration/timing/severity/associated sxs/prior treatment) Patient is a 31 y.o. male presenting with hypertension. The history is provided by the patient. No language interpreter was used.  Hypertension This is a new problem. The problem occurs daily. The problem has been unchanged. Associated symptoms include headaches. Pertinent negatives include no chest pain, numbness, visual change or weakness. The symptoms are aggravated by nothing. He has tried nothing for the symptoms.  Patient was found to be hypertensive by his psychiatrist and sent to ED for evaluation.    Past Medical History  Diagnosis Date  . PTSD (post-traumatic stress disorder)   . Bipolar 1 disorder     History reviewed. No pertinent past surgical history.  No family history on file.  History  Substance Use Topics  . Smoking status: Not on file  . Smokeless tobacco: Not on file  . Alcohol Use:       Review of Systems  Cardiovascular: Negative for chest pain.  Neurological: Positive for headaches. Negative for weakness and numbness.  All other systems reviewed and are negative.    Allergies  Review of patient's allergies indicates no known allergies.  Home Medications   Current Outpatient Rx  Name Route Sig Dispense Refill  . ALUMINUM & MAGNESIUM HYDROXIDE 225-200 MG/5ML PO SUSP Oral Take 30 mLs by mouth every 6 (six) hours as needed. Stomach pain     . AMPHETAMINE-DEXTROAMPHETAMINE 20 MG PO TABS Oral Take 10 mg by mouth 2 (two) times daily.      Marland Kitchen LITHIUM CARBONATE 150 MG PO CAPS Oral Take 150 mg by mouth 2 (two) times daily with a meal.      . QUETIAPINE FUMARATE 400 MG PO TABS Oral Take 400 mg by mouth at bedtime.        BP 139/94  Pulse 81  Temp(Src) 98.7 F (37.1 C)  (Oral)  Resp 20  Ht 5\' 10"  (1.778 m)  Wt 214 lb (97.07 kg)  BMI 30.71 kg/m2  SpO2 97%  Physical Exam  Nursing note and vitals reviewed. Constitutional: He is oriented to person, place, and time. He appears well-developed and well-nourished.  HENT:  Head: Normocephalic and atraumatic.  Eyes: Conjunctivae and EOM are normal. Pupils are equal, round, and reactive to light.  Neck: Normal range of motion. Neck supple.  Cardiovascular: Normal rate, regular rhythm, normal heart sounds and intact distal pulses.   No murmur heard. Pulmonary/Chest: Effort normal and breath sounds normal.  Abdominal: Soft. Bowel sounds are normal.  Musculoskeletal: Normal range of motion.  Neurological: He is alert and oriented to person, place, and time.  Skin: Skin is warm and dry.  Psychiatric: He has a normal mood and affect.    ED Course  Procedures (including critical care time)   Labs Reviewed  CBC  DIFFERENTIAL  COMPREHENSIVE METABOLIC PANEL  TSH   No results found.   No diagnosis found.  EKG: normal EKG, normal sinus rhythm, unchanged from previous tracings.   MDM          Jimmye Norman, NP 08/02/11 2001

## 2011-08-02 NOTE — ED Provider Notes (Signed)
Medical screening examination/treatment/procedure(s) were performed by non-physician practitioner and as supervising physician I was immediately available for consultation/collaboration.  Ethelda Chick, MD 08/02/11 2013

## 2011-08-02 NOTE — ED Notes (Signed)
Pt ambulatory with steady gait.  No verbal complaints.

## 2011-08-02 NOTE — ED Notes (Signed)
Pt. Was sent to [place] from his psychiatrist, Horald Pollen , MD at [facility], to be cleared to take his meds,  His BP has been elevated for 2 days,  He denies any symptoms

## 2011-08-02 NOTE — ED Notes (Signed)
Pt given coke.  Awaiting disposition.

## 2011-09-27 ENCOUNTER — Encounter (HOSPITAL_BASED_OUTPATIENT_CLINIC_OR_DEPARTMENT_OTHER): Payer: Self-pay | Admitting: Emergency Medicine

## 2011-09-27 ENCOUNTER — Emergency Department (HOSPITAL_BASED_OUTPATIENT_CLINIC_OR_DEPARTMENT_OTHER)
Admission: EM | Admit: 2011-09-27 | Discharge: 2011-09-27 | Disposition: A | Discharge status: Home / Self Care | Payer: Self-pay | Attending: Emergency Medicine | Admitting: Emergency Medicine

## 2011-09-27 ENCOUNTER — Emergency Department (INDEPENDENT_AMBULATORY_CARE_PROVIDER_SITE_OTHER): Payer: Self-pay

## 2011-09-27 DIAGNOSIS — S60229A Contusion of unspecified hand, initial encounter: Secondary | ICD-10-CM | POA: Insufficient documentation

## 2011-09-27 DIAGNOSIS — F17200 Nicotine dependence, unspecified, uncomplicated: Secondary | ICD-10-CM | POA: Insufficient documentation

## 2011-09-27 DIAGNOSIS — F319 Bipolar disorder, unspecified: Secondary | ICD-10-CM | POA: Insufficient documentation

## 2011-09-27 DIAGNOSIS — Y92009 Unspecified place in unspecified non-institutional (private) residence as the place of occurrence of the external cause: Secondary | ICD-10-CM | POA: Insufficient documentation

## 2011-09-27 DIAGNOSIS — M7989 Other specified soft tissue disorders: Secondary | ICD-10-CM

## 2011-09-27 DIAGNOSIS — M79609 Pain in unspecified limb: Secondary | ICD-10-CM

## 2011-09-27 DIAGNOSIS — S6990XA Unspecified injury of unspecified wrist, hand and finger(s), initial encounter: Secondary | ICD-10-CM

## 2011-09-27 DIAGNOSIS — X838XXA Intentional self-harm by other specified means, initial encounter: Secondary | ICD-10-CM

## 2011-09-27 MED ORDER — OXYCODONE-ACETAMINOPHEN 5-325 MG PO TABS
1.0000 | ORAL_TABLET | Freq: Once | ORAL | Status: AC
  Administered 2011-09-27: 1 via ORAL
  Filled 2011-09-27: qty 1

## 2011-09-27 MED ORDER — OXYCODONE-ACETAMINOPHEN 5-325 MG PO TABS: 1.0000 | ORAL_TABLET | Freq: Four times a day (QID) | ORAL | Status: AC | PRN

## 2011-09-27 NOTE — ED Notes (Signed)
Pt has injury to right hand after punching wall last night. Pt reports he has PTSD and awoke confused.

## 2011-09-27 NOTE — ED Provider Notes (Signed)
History     CSN: [id number]  Arrival date & time 09/27/11  4098   First MD Initiated Contact with Patient 09/27/11 0703      Chief Complaint  Patient presents with  . Hand Injury    (Consider location/radiation/quality/duration/timing/severity/associated sxs/prior treatment) Patient is a 31 y.o. male presenting with hand injury. The history is provided by the patient.  Hand Injury  The incident occurred 1 to 2 hours ago. The incident occurred at home. The injury mechanism was a direct blow (Punched a wall). The pain is present in the right hand. The quality of the pain is described as throbbing and aching. The pain is at a severity of 8/10. The pain is moderate. The pain has been constant since the incident. Pertinent negatives include no fever. The symptoms are aggravated by movement, palpation and use. He has tried ice for the symptoms. The treatment provided mild relief.    Past Medical History  Diagnosis Date  . PTSD (post-traumatic stress disorder)   . Bipolar 1 disorder     Past Surgical History  Procedure Date  . Appendectomy   . Heel spur surgery   . Foot surgery     No family history on file.  History  Substance Use Topics  . Smoking status: Current Everyday Smoker  . Smokeless tobacco: Not on file  . Alcohol Use: Yes      Review of Systems  Constitutional: Negative for fever.  All other systems reviewed and are negative.    Allergies  Review of patient's allergies indicates no known allergies.  Home Medications   Current Outpatient Rx  Name Route Sig Dispense Refill  . ALUMINUM & MAGNESIUM HYDROXIDE 225-200 MG/5ML PO SUSP Oral Take 30 mLs by mouth every 6 (six) hours as needed. Stomach pain     . AMPHETAMINE-DEXTROAMPHETAMINE 20 MG PO TABS Oral Take 10 mg by mouth 2 (two) times daily.      Marland Kitchen LITHIUM CARBONATE 150 MG PO CAPS Oral Take 150 mg by mouth 2 (two) times daily with a meal.      . QUETIAPINE FUMARATE 400 MG PO TABS Oral Take 400 mg by  mouth at bedtime.        BP 141/94  Pulse 104  Temp(Src) 97.9 F (36.6 C) (Oral)  Resp 18  SpO2 100%  Physical Exam  Nursing note and vitals reviewed. Constitutional: He is oriented to person, place, and time. He appears well-developed and well-nourished. No distress.  HENT:  Head: Normocephalic and atraumatic.  Mouth/Throat: Oropharynx is clear and moist.  Eyes: EOM are normal. Pupils are equal, round, and reactive to light.  Musculoskeletal: He exhibits tenderness.       Right hand: He exhibits tenderness, bony tenderness and swelling. He exhibits normal range of motion, normal capillary refill, no deformity and no laceration.       Hands: Neurological: He is alert and oriented to person, place, and time.  Skin: Skin is warm and dry.    ED Course  Procedures (including critical care time)  Labs Reviewed - No data to display Dg Hand Complete Right  09/27/2011  *RADIOLOGY REPORT*  Clinical Data: Pain and swelling after blunt trauma.  RIGHT HAND - COMPLETE 3+ VIEW  Comparison: 01/28/2011  Findings: There is no acute fracture or dislocation.  There are arthritic changes between the hamate and the base of the fifth metacarpal which are stable.  Soft tissue swelling is seen of the dorsum of the metacarpals of the fingers.  IMPRESSION:  No acute osseous abnormality.  Original Report Authenticated By: Gwynn Burly, M.D.     1. Hand contusion       MDM   Patient punched a wall about an hour ago due to being angry and hit a stud. Redness, swelling, pain over the third and fourth MCP joints. Neurovascularly intact. Normal capillary refill and sensation. Plain films negative for acute fracture. Hand wrapped patient is to continue ice and elevation.        Gwyneth Sprout, MD 09/27/11 [phone]

## 2011-09-27 NOTE — Discharge Instructions (Signed)
Contusion, Hand Bruises (contusions) happen when an injury causes bleeding under the skin. Signs of bruising include pain, puffiness (swelling), and discolored skin.The bruise may turn blue, green, purple, or yellow. It will be tender when you touch it. Puffiness can make it hard to move your hand. HOME CARE  Only take medicine as told by your doctor.   Put ice on the injured area.   Put ice in a plastic bag.   Place a towel between the skin and the bag.   Leave the ice on the injury for 15 to 20 minutes, 3 to 4 times a day.   An elastic bandage may be used to keep puffiness down.   Massage from the hand to the elbow to keep the puffiness down. Do this gently. Open and close your fist while doing this.   Keep the hand raised (elevated) above the level of the heart. This lessens puffiness and pain.   Try to limit the use of the injured hand as much as possible. Do as your doctor suggests.   See your doctor for a follow-up visit as told. This is necessary. If you do not, it could delay healing.  GET HELP RIGHT AWAY IF:   The pain and puffiness get worse.   The pain is not controlled with medicine.   You have a temperature by mouth above 102 F (38.9 C), not controlled by medicine.   The hand feels warm and there is pain when moving the fingers.   The bruise is not getting better.   There is yellowish white fluid (pus) coming from the wound.   You have a headache, muscle ache, or feel dizzy and sick.   There are new problems.  MAKE SURE YOU:   Understand these instructions.   Will watch this condition.   Will get help right away if you are not doing well or you get worse.  Document Released: 01/02/2008 Document Revised: 03/28/2011 Document Reviewed: 12/23/2009 [facility] Patient Information 2012 [place], [place].

## 2012-02-11 ENCOUNTER — Emergency Department (HOSPITAL_BASED_OUTPATIENT_CLINIC_OR_DEPARTMENT_OTHER): Payer: Self-pay

## 2012-02-11 ENCOUNTER — Emergency Department (HOSPITAL_BASED_OUTPATIENT_CLINIC_OR_DEPARTMENT_OTHER)
Admission: EM | Admit: 2012-02-11 | Discharge: 2012-02-11 | Disposition: A | Discharge status: Home / Self Care | Payer: Self-pay | Attending: Emergency Medicine | Admitting: Emergency Medicine

## 2012-02-11 ENCOUNTER — Encounter (HOSPITAL_BASED_OUTPATIENT_CLINIC_OR_DEPARTMENT_OTHER): Payer: Self-pay | Admitting: *Deleted

## 2012-02-11 DIAGNOSIS — IMO0002 Reserved for concepts with insufficient information to code with codable children: Secondary | ICD-10-CM | POA: Insufficient documentation

## 2012-02-11 DIAGNOSIS — S3022XA Contusion of scrotum and testes, initial encounter: Secondary | ICD-10-CM

## 2012-02-11 DIAGNOSIS — R110 Nausea: Secondary | ICD-10-CM | POA: Insufficient documentation

## 2012-02-11 DIAGNOSIS — N509 Disorder of male genital organs, unspecified: Secondary | ICD-10-CM | POA: Insufficient documentation

## 2012-02-11 DIAGNOSIS — Z79899 Other long term (current) drug therapy: Secondary | ICD-10-CM | POA: Insufficient documentation

## 2012-02-11 DIAGNOSIS — N5089 Other specified disorders of the male genital organs: Secondary | ICD-10-CM | POA: Insufficient documentation

## 2012-02-11 DIAGNOSIS — F319 Bipolar disorder, unspecified: Secondary | ICD-10-CM | POA: Insufficient documentation

## 2012-02-11 DIAGNOSIS — Y9311 Activity, swimming: Secondary | ICD-10-CM | POA: Insufficient documentation

## 2012-02-11 LAB — URINE MICROSCOPIC-ADD ON

## 2012-02-11 LAB — URINALYSIS, ROUTINE W REFLEX MICROSCOPIC
Bilirubin Urine: NEGATIVE
Glucose, UA: NEGATIVE mg/dL
Ketones, ur: NEGATIVE mg/dL
Nitrite: NEGATIVE
Protein, ur: NEGATIVE mg/dL
Specific Gravity, Urine: 1.027 (ref 1.005–1.030)
Urobilinogen, UA: 0.2 mg/dL (ref 0.0–1.0)
pH: 6 (ref 5.0–8.0)

## 2012-02-11 MED ORDER — OXYCODONE-ACETAMINOPHEN 5-325 MG PO TABS: ORAL_TABLET | ORAL | Status: AC

## 2012-02-11 MED ORDER — HYDROMORPHONE HCL PF 1 MG/ML IJ SOLN
1.0000 mg | Freq: Once | INTRAMUSCULAR | Status: AC
  Administered 2012-02-11: 1 mg via INTRAMUSCULAR
  Filled 2012-02-11: qty 1

## 2012-02-11 MED ORDER — OXYCODONE-ACETAMINOPHEN 5-325 MG PO TABS
2.0000 | ORAL_TABLET | Freq: Once | ORAL | Status: AC
  Administered 2012-02-11: 2 via ORAL
  Filled 2012-02-11: qty 2

## 2012-02-11 NOTE — Discharge Instructions (Signed)
Narcotic and benzodiazepine use may cause drowsiness, slowed breathing or dependence.  Please use with caution and do not drive, operate machinery or watch young children alone while taking them.  Taking combinations of these medications or drinking alcohol will potentiate these effects.    
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
## 2012-02-11 NOTE — ED Provider Notes (Signed)
History  This chart was scribed for Gavin Pound. Oletta Lamas, MD by Bennett Scrape. This patient was seen in room MH08/MH08 and the patient's care was started at 7:18PM.  CSN: [id number]  Arrival date & time 02/11/12  1849   First MD Initiated Contact with Patient 02/11/12 1918      Chief Complaint  Patient presents with  . Testicle Pain    The history is provided by the patient. No language interpreter was used.    Ernest Escobar is a 31 y.o. male who presents to the Emergency Department complaining of 33 hours of sudden onset, gradually worsening, constant left testicle pain with associated nausea that started after he landed on the left testicle while jumping onto a pool float in a pool. The pain is worse with touch and pressure. He has been taking ibuprofen with mild improvement in the pain. He reports that he had been drinking EtOH and did not feel the pain immediately but became concerned when he noticed blood in his ejaculate. He denies having prior episodes of similar symptoms. He denies emesis, fever, chills and abdominal pain as associated symptoms. He has a h/o PTSD and bipolar disorder. He is a current everyday smoker and daily alcohol user.   Past Medical History  Diagnosis Date  . PTSD (post-traumatic stress disorder)   . Bipolar 1 disorder     Past Surgical History  Procedure Date  . Appendectomy   . Heel spur surgery   . Foot surgery     History reviewed. No pertinent family history.  History  Substance Use Topics  . Smoking status: Current Everyday Smoker  . Smokeless tobacco: Never Used  . Alcohol Use: 7.2 oz/week    12 Shots of liquor per week      Review of Systems  Constitutional: Negative for fever and chills.  Gastrointestinal: Positive for nausea. Negative for vomiting and abdominal pain.  Genitourinary: Positive for scrotal swelling and testicular pain. Negative for hematuria and penile pain.  Skin: Negative for color change.  All other systems  reviewed and are negative.    Allergies  Review of patient's allergies indicates no known allergies.  Home Medications   Current Outpatient Rx  Name Route Sig Dispense Refill  . IBUPROFEN 200 MG PO TABS Oral Take 400 mg by mouth every 6 (six) hours as needed. Patient used this medication for pain.    Marland Kitchen QUETIAPINE FUMARATE 400 MG PO TABS Oral Take 400 mg by mouth at bedtime.     . OXYCODONE-ACETAMINOPHEN 5-325 MG PO TABS  1-2 tablets po q 6 hours prn moderate to severe pain 20 tablet 0    Triage Vitals: BP 157/99  Pulse 117  Temp 99 F (37.2 C)  Resp 20  Ht 5\' 9"  (1.753 m)  Wt 227 lb (102.967 kg)  BMI 33.52 kg/m2  SpO2 99%  Physical Exam  Nursing note and vitals reviewed. Constitutional: He is oriented to person, place, and time. He appears well-developed and well-nourished. No distress.  HENT:  Head: Normocephalic and atraumatic.  Eyes: Conjunctivae and EOM are normal.  Neck: Neck supple. No tracheal deviation present.  Cardiovascular: Normal rate.   Pulmonary/Chest: Effort normal. No respiratory distress.  Abdominal: Soft. He exhibits no distension. There is no tenderness. Hernia confirmed negative in the right inguinal area and confirmed negative in the left inguinal area.  Genitourinary: Penis normal. Right testis shows no mass. Cremasteric reflex is not absent on the right side. Left testis shows swelling and tenderness. Left  testis shows no mass. Cremasteric reflex is not absent on the left side. Circumcised. No penile tenderness.       No discoloration, left testicle is enlarged and tender diffusely mostly along the underside, no other mass noted  Musculoskeletal: Normal range of motion.  Neurological: He is alert and oriented to person, place, and time.  Skin: Skin is warm and dry.  Psychiatric: He has a normal mood and affect. His behavior is normal.    ED Course  Procedures (including critical care time)  DIAGNOSTIC STUDIES: Oxygen Saturation is 99% on room  air, normal by my interpretation.    COORDINATION OF CARE: 7:28PM-Discussed treatment plan with pt at bedside and pt agreed to plan.   Labs Reviewed  URINALYSIS, ROUTINE W REFLEX MICROSCOPIC - Abnormal; Notable for the following:    Hgb urine dipstick MODERATE (*)     Leukocytes, UA MODERATE (*)     All other components within normal limits  URINE MICROSCOPIC-ADD ON   US Scrotum  02/11/2012  *RADIOLOGY REPORT*  Clinical Data:  Testicular pain secondary to trauma yesterday. Bloody semen.  SCROTAL ULTRASOUND DOPPLER ULTRASOUND OF THE TESTICLES  Technique: Complete ultrasound examination of the testicles, epididymis, and other scrotal structures was performed.  Color and spectral Doppler ultrasound were also utilized to evaluate blood flow to the testicles.  Comparison:  None  Findings:  Right testis:  Normal.  4.1 x 2.4 x 3.2 cm.  Left testis:  Normal.  4.2 x 2.8 x 3.2 cm.  Right epididymis:  Increased blood flow.  Left epididymis:  Increased blood flow.  Hydrocele:  Small bilateral hydroceles with debris in the fluid.  Varicocele:  There is prominent increased flow adjacent to the left testicle with Valsalva maneuver consistent with a varicocele.  Pulsed Doppler interrogation of both testes demonstrates low resistance flow bilaterally. Normal perfusion to both testicles.  IMPRESSION:  1.  Small bilateral hydroceles with debris which could represent hemorrhage. 2.  Left varicocele. 3.  Intact testicles. 4.  Increased perfusion of both epididymides which could be due to trauma or inflammation.  Original Report Authenticated By: Gwynn Burly, M.D.   I reviewed U/S results myself.    1. Traumatic scrotal hematoma       MDM  I personally performed the services described in this documentation, which was scribed in my presence. The recorded information has been reviewed and considered.    Pt with scrotal injury, hemorrhage seen with ejaculation which I informed him should improve with time,  can follow up with urology as needed.  Analgesic Rx.  No difficulty urinating.        Gavin Pound. Oletta Lamas, MD 02/11/12 2114

## 2012-02-14 ENCOUNTER — Encounter (HOSPITAL_COMMUNITY): Payer: Self-pay | Admitting: *Deleted

## 2012-02-14 ENCOUNTER — Emergency Department (HOSPITAL_COMMUNITY)
Admission: EM | Admit: 2012-02-14 | Discharge: 2012-02-14 | Disposition: A | Discharge status: Home / Self Care | Payer: Self-pay | Attending: Emergency Medicine | Admitting: Emergency Medicine

## 2012-02-14 ENCOUNTER — Emergency Department (HOSPITAL_COMMUNITY): Payer: Self-pay

## 2012-02-14 ENCOUNTER — Telehealth (HOSPITAL_BASED_OUTPATIENT_CLINIC_OR_DEPARTMENT_OTHER): Payer: Self-pay | Admitting: *Deleted

## 2012-02-14 DIAGNOSIS — Z9089 Acquired absence of other organs: Secondary | ICD-10-CM | POA: Insufficient documentation

## 2012-02-14 DIAGNOSIS — F17200 Nicotine dependence, unspecified, uncomplicated: Secondary | ICD-10-CM | POA: Insufficient documentation

## 2012-02-14 DIAGNOSIS — S3022XA Contusion of scrotum and testes, initial encounter: Secondary | ICD-10-CM

## 2012-02-14 DIAGNOSIS — Y9239 Other specified sports and athletic area as the place of occurrence of the external cause: Secondary | ICD-10-CM | POA: Insufficient documentation

## 2012-02-14 DIAGNOSIS — Y9311 Activity, swimming: Secondary | ICD-10-CM | POA: Insufficient documentation

## 2012-02-14 DIAGNOSIS — F319 Bipolar disorder, unspecified: Secondary | ICD-10-CM | POA: Insufficient documentation

## 2012-02-14 DIAGNOSIS — IMO0002 Reserved for concepts with insufficient information to code with codable children: Secondary | ICD-10-CM | POA: Insufficient documentation

## 2012-02-14 DIAGNOSIS — W2209XA Striking against other stationary object, initial encounter: Secondary | ICD-10-CM | POA: Insufficient documentation

## 2012-02-14 LAB — URINALYSIS, ROUTINE W REFLEX MICROSCOPIC
Bilirubin Urine: NEGATIVE
Hgb urine dipstick: NEGATIVE
Ketones, ur: NEGATIVE mg/dL
Nitrite: NEGATIVE
Specific Gravity, Urine: 1.024 (ref 1.005–1.030)
Urobilinogen, UA: 0.2 mg/dL (ref 0.0–1.0)

## 2012-02-14 LAB — URINE MICROSCOPIC-ADD ON

## 2012-02-14 MED ORDER — SULFAMETHOXAZOLE-TRIMETHOPRIM 800-160 MG PO TABS: 1.0000 | ORAL_TABLET | Freq: Two times a day (BID) | ORAL | Status: AC

## 2012-02-14 MED ORDER — OXYCODONE-ACETAMINOPHEN 5-325 MG PO TABS
2.0000 | ORAL_TABLET | Freq: Once | ORAL | Status: AC
  Administered 2012-02-14: 2 via ORAL
  Filled 2012-02-14: qty 2

## 2012-02-14 MED ORDER — OXYCODONE-ACETAMINOPHEN 5-325 MG PO TABS: 1.0000 | ORAL_TABLET | Freq: Four times a day (QID) | ORAL | Status: AC | PRN

## 2012-02-14 NOTE — ED Notes (Signed)
Seen on Monday at [facility] for injury to scrotum, landed on pool float, bruising scrotum. Is nauseated, continues to have pain,

## 2012-02-14 NOTE — ED Notes (Signed)
US at bedside  Pt alert and oriented x4. Respirations even and unlabored, bilateral symmetrical rise and fall of chest. Skin warm and dry. In no acute distress. Denies needs.   

## 2012-02-14 NOTE — ED Notes (Signed)
Numerous phone calls received from patient and his mother.  Verbally threatening our secretary  to sue the hospital if we do not get them a referral to Urology.  Call placed to patient and after verifying ID, confirmed that the patient was not having an acute emergency at this time.  Patient stated that he continued to have pain and swelling and when he attempted to contact [facility] was told he would have to have 300.00 up front fee.  Patient states he does not have insurance and does not have the money to pay.  Call placed to [facility] and received info for the patient to call back and ask for Dawn in the business office to make arrangements.  Returned call to the patient with the above info.  Explained that we could not make them see the patient.  Encouraged patient that if he cannot afford the arrangements with the Urologist, he will need to return to the ED for further evaluation and treatments.
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
## 2012-02-15 NOTE — ED Provider Notes (Signed)
Medical screening examination/treatment/procedure(s) were performed by non-physician practitioner and as supervising physician I was immediately available for consultation/collaboration.  Flint Melter, MD 02/15/12 [phone]

## 2012-03-14 ENCOUNTER — Emergency Department (HOSPITAL_COMMUNITY): Payer: Self-pay

## 2012-03-14 ENCOUNTER — Encounter (HOSPITAL_COMMUNITY): Payer: Self-pay | Admitting: Family Medicine

## 2012-03-14 ENCOUNTER — Emergency Department (HOSPITAL_COMMUNITY)
Admission: EM | Admit: 2012-03-14 | Discharge: 2012-03-14 | Disposition: A | Discharge status: Home / Self Care | Payer: Self-pay | Attending: Emergency Medicine | Admitting: Emergency Medicine

## 2012-03-14 DIAGNOSIS — N453 Epididymo-orchitis: Secondary | ICD-10-CM | POA: Insufficient documentation

## 2012-03-14 DIAGNOSIS — F319 Bipolar disorder, unspecified: Secondary | ICD-10-CM | POA: Insufficient documentation

## 2012-03-14 DIAGNOSIS — N451 Epididymitis: Secondary | ICD-10-CM

## 2012-03-14 DIAGNOSIS — Z9089 Acquired absence of other organs: Secondary | ICD-10-CM | POA: Insufficient documentation

## 2012-03-14 DIAGNOSIS — F17200 Nicotine dependence, unspecified, uncomplicated: Secondary | ICD-10-CM | POA: Insufficient documentation

## 2012-03-14 MED ORDER — CEFTRIAXONE SODIUM 250 MG IJ SOLR
250.0000 mg | Freq: Once | INTRAMUSCULAR | Status: AC
  Administered 2012-03-14: 250 mg via INTRAMUSCULAR
  Filled 2012-03-14: qty 250

## 2012-03-14 MED ORDER — HYDROCODONE-ACETAMINOPHEN 5-325 MG PO TABS
2.0000 | ORAL_TABLET | Freq: Once | ORAL | Status: AC
  Administered 2012-03-14: 2 via ORAL
  Filled 2012-03-14: qty 2

## 2012-03-14 MED ORDER — HYDROCODONE-ACETAMINOPHEN 5-325 MG PO TABS: ORAL_TABLET | ORAL | Status: AC

## 2012-03-14 MED ORDER — AZITHROMYCIN 250 MG PO TABS
1000.0000 mg | ORAL_TABLET | Freq: Once | ORAL | Status: AC
  Administered 2012-03-14: 1000 mg via ORAL
  Filled 2012-03-14: qty 4

## 2012-03-14 MED ORDER — DOXYCYCLINE HYCLATE 100 MG PO CAPS: 100.0000 mg | ORAL_CAPSULE | Freq: Two times a day (BID) | ORAL | Status: AC

## 2012-03-14 NOTE — ED Notes (Signed)
Pt reports testicular injury x1 month. States 1 week ago noticed left testicle feels hard to touch at bottom and very painful. States "it looks deformed."

## 2012-03-14 NOTE — ED Provider Notes (Signed)
History     CSN: [id number]  Arrival date & time 03/14/12  1444   First MD Initiated Contact with Patient 03/14/12 1547      Chief Complaint  Patient presents with  . Testicle Pain    (Consider location/radiation/quality/duration/timing/severity/associated sxs/prior treatment) HPI Comments: Patient present with complaint of left testicular pain. Patient had an injury to the left testicle one month ago. He was seen in emergency department on 2 occasions and had ultrasound which showed increased blood flow in contusion. He had bloody ejaculate at that time that since improved. Patient states that the pain gradually improved as well however the past week he is having increasing pain to the inferior aspect of his left testicle. Patient states that the pain is sharp and does not radiate. He has not had any dysuria, bloody ejaculate, change in bowel movements, abdominal pain. He has been taking Tylenol with mild relief of pain. Palpation of the testicle makes the pain worse. Onset acute. Course is constant. Pt states he cannot afford to follow-up with a urologist despite getting referrals.   Patient is a 31 y.o. male presenting with testicular pain. The history is provided by the patient and medical records.  Testicle Pain This is a recurrent problem. The current episode started more than 1 month ago. The problem occurs constantly. The problem has been gradually worsening. Pertinent negatives include no abdominal pain, chest pain, coughing, fever, headaches, myalgias, nausea, rash, sore throat or vomiting. Exacerbated by: palpation of testicle. He has tried acetaminophen for the symptoms. The treatment provided mild relief.    Past Medical History  Diagnosis Date  . PTSD (post-traumatic stress disorder)   . Bipolar 1 disorder     Past Surgical History  Procedure Date  . Appendectomy   . Heel spur surgery   . Foot surgery     History reviewed. No pertinent family history.  History    Substance Use Topics  . Smoking status: Current Everyday Smoker  . Smokeless tobacco: Never Used  . Alcohol Use: 7.2 oz/week    12 Shots of liquor per week      Review of Systems  Constitutional: Negative for fever.  HENT: Negative for sore throat and rhinorrhea.   Eyes: Negative for redness.  Respiratory: Negative for cough.   Cardiovascular: Negative for chest pain.  Gastrointestinal: Negative for nausea, vomiting, abdominal pain and diarrhea.  Genitourinary: Positive for testicular pain. Negative for dysuria, hematuria, flank pain, decreased urine volume, discharge, scrotal swelling and penile pain.  Musculoskeletal: Negative for myalgias.  Skin: Negative for rash.  Neurological: Negative for headaches.    Allergies  Review of patient's allergies indicates no known allergies.  Home Medications  No current outpatient prescriptions on file.  BP 145/75  Pulse 78  Temp 98.1 F (36.7 C) (Oral)  Resp 18  SpO2 100%  Physical Exam  Nursing note and vitals reviewed. Constitutional: He appears well-developed and well-nourished.  HENT:  Head: Normocephalic and atraumatic.  Eyes: Conjunctivae are normal. Right eye exhibits no discharge. Left eye exhibits no discharge.  Neck: Normal range of motion. Neck supple.  Cardiovascular: Normal rate, regular rhythm and normal heart sounds.   Pulmonary/Chest: Effort normal and breath sounds normal.  Abdominal: Soft. There is no tenderness.  Genitourinary:    Right testis shows no mass, no swelling and no tenderness. Left testis shows tenderness. Left testis shows no swelling. No penile tenderness. No discharge found.  Neurological: He is alert.  Skin: Skin is warm and dry.  Psychiatric: He has a normal mood and affect.    ED Course  Procedures (including critical care time)  Labs Reviewed - No data to display US Scrotum  03/14/2012  *RADIOLOGY REPORT*  Clinical Data: Left testicular injury 1 month ago with the inferior  testicle firm and painful.  ULTRASOUND OF SCROTUM  Technique:  Complete ultrasound examination of the testicles, epididymis, and other scrotal structures was performed.  DOPPLER ULTRASOUND OF SCROTAL VESSELS  Technique:  Color and duplex Doppler ultrasound was utilized to evaluate blood flow to the testicles and scrotal contents.  Comparison: 02/14/2012  Findings: Right testicle 4.7 x 2.7 x 3.0 cm.  Normal color and spectral Doppler.  Normal gray scale appearance.  Left testicle 4.9 x 2.7 x 3.0 cm.  Normal gray scale appearance. Normal color and spectral Doppler.  The right epididymis is within normal limits.  The left epididymis is mildly prominent.  Demonstrates hyperemia, especially inferiorly on image 43.  Decrease size of now tiny bilateral hydroceles.  No evidence of varicocele.  IMPRESSION:  1.  Hyperemic, mildly prominent left epididymis, suspicious for epididymitis. 2.  Decreased size of tiny bilateral hydroceles.  Original Report Authenticated By: Consuello Bossier, M.D.   [place] Art/ven Flow Abd Pelv Doppler  03/14/2012  *RADIOLOGY REPORT*  Clinical Data: Left testicular injury 1 month ago with the inferior testicle firm and painful.  ULTRASOUND OF SCROTUM  Technique:  Complete ultrasound examination of the testicles, epididymis, and other scrotal structures was performed.  DOPPLER ULTRASOUND OF SCROTAL VESSELS  Technique:  Color and duplex Doppler ultrasound was utilized to evaluate blood flow to the testicles and scrotal contents.  Comparison: 02/14/2012  Findings: Right testicle 4.7 x 2.7 x 3.0 cm.  Normal color and spectral Doppler.  Normal gray scale appearance.  Left testicle 4.9 x 2.7 x 3.0 cm.  Normal gray scale appearance. Normal color and spectral Doppler.  The right epididymis is within normal limits.  The left epididymis is mildly prominent.  Demonstrates hyperemia, especially inferiorly on image 43.  Decrease size of now tiny bilateral hydroceles.  No evidence of varicocele.  IMPRESSION:  1.   Hyperemic, mildly prominent left epididymis, suspicious for epididymitis. 2.  Decreased size of tiny bilateral hydroceles.  Original Report Authenticated By: Consuello Bossier, M.D.     1. Epididymitis     4:09 PM Patient seen and examined. Medications ordered.   Vital signs reviewed and are as follows: Filed Vitals:   03/14/12 1532  BP: 145/75  Pulse: 78  Temp: 98.1 F (36.7 C)  Resp: 18   Patient discussed with Dr. Ethelda Chick. [place] ordered.   [place] c/w epididymitis. Possibly traumatic etiology, however patient is sexually active so will treat. Patient informed of all results. Urged urology follow-up.   Patient counseled on use of narcotic pain medications. Counseled not to combine these medications with others containing tylenol. Urged not to drink alcohol, drive, or perform any other activities that requires focus while taking these medications. The patient verbalizes understanding and agrees with the plan.  Told patient to return with worsening pain, N/V, other concerns. Patient verbalizes understanding and agrees with plan.     MDM  Testicular pain 1 month s/p trauma. Shows epididymitis. Otherwise non-concerning. Pain control, urology f/u prn.         Renne Crigler, [place] 03/14/12 1925

## 2012-03-14 NOTE — ED Provider Notes (Signed)
Medical screening examination/treatment/procedure(s) were conducted as a shared visit with non-physician practitioner(s) and myself.  I personally evaluated the patient during the encounter  Doug Sou, MD 03/14/12 2322

## 2012-03-14 NOTE — ED Provider Notes (Signed)
complains of left testicle pain for possibly one month on exam alert nontoxic genitalia no gross abnormality of scrotum left testicle is tender inferiorly and posteriorly no redness or swelling to scrotum  Doug Sou, MD 03/14/12 1806

## 2012-04-25 ENCOUNTER — Emergency Department (HOSPITAL_BASED_OUTPATIENT_CLINIC_OR_DEPARTMENT_OTHER)
Admission: EM | Admit: 2012-04-25 | Discharge: 2012-04-25 | Disposition: A | Discharge status: Home / Self Care | Payer: Self-pay | Attending: Emergency Medicine | Admitting: Emergency Medicine

## 2012-04-25 ENCOUNTER — Encounter (HOSPITAL_BASED_OUTPATIENT_CLINIC_OR_DEPARTMENT_OTHER): Payer: Self-pay | Admitting: *Deleted

## 2012-04-25 DIAGNOSIS — F4310 Post-traumatic stress disorder, unspecified: Secondary | ICD-10-CM | POA: Insufficient documentation

## 2012-04-25 DIAGNOSIS — R51 Headache: Secondary | ICD-10-CM | POA: Insufficient documentation

## 2012-04-25 DIAGNOSIS — F319 Bipolar disorder, unspecified: Secondary | ICD-10-CM | POA: Insufficient documentation

## 2012-04-25 DIAGNOSIS — F17200 Nicotine dependence, unspecified, uncomplicated: Secondary | ICD-10-CM | POA: Insufficient documentation

## 2012-04-25 MED ORDER — METOCLOPRAMIDE HCL 5 MG/ML IJ SOLN
10.0000 mg | Freq: Once | INTRAMUSCULAR | Status: AC
  Administered 2012-04-25: 10 mg via INTRAMUSCULAR
  Filled 2012-04-25: qty 2

## 2012-04-25 MED ORDER — KETOROLAC TROMETHAMINE 60 MG/2ML IM SOLN
60.0000 mg | Freq: Once | INTRAMUSCULAR | Status: AC
  Administered 2012-04-25: 60 mg via INTRAMUSCULAR
  Filled 2012-04-25: qty 2

## 2012-04-25 MED ORDER — DIPHENHYDRAMINE HCL 50 MG/ML IJ SOLN
25.0000 mg | Freq: Once | INTRAMUSCULAR | Status: AC
  Administered 2012-04-25: 25 mg via INTRAVENOUS
  Filled 2012-04-25: qty 1

## 2012-04-25 NOTE — ED Notes (Signed)
Headache x 3 weeks. Saw his MD today and was told his BP was high and to go to the ED.

## 2012-04-25 NOTE — ED Provider Notes (Signed)
History     CSN: [id number]  Arrival date & time 04/25/12  1350   First MD Initiated Contact with Patient 04/25/12 1353      Chief Complaint  Patient presents with  . Headache    (Consider location/radiation/quality/duration/timing/severity/associated sxs/prior treatment) HPI Comments: Patient with history of PTSD presents from psychiatrist's office with elevated blood pressure and headache. Blood pressure has been documented by his psychiatrist as being elevated for many months. It was 156/102 in the office, prompting his doctor to send him to the ED. He also has a 2.5 week history of a headache. The headache began one morning when he woke up and has been constant since then. It is located in a band-like distribution from one ear to the other stretching over the top of his head. The pain is sharp and stabbing. It has not been relieved with ibuprofen or tylenol. Nothing has made it better or worse. Denies photophobia, neck stiffness, nausea, vomiting, diarrhea, fever, chest pain, or shortness of breath. Patient endorses fatigue, intermittent blurry vision but no vision loss, ringing in his ears, tingling in his jaw, hot flashes, and sweating. He has unintentionally lost 8 lbs over the course of 2 weeks but feels bloated in his abdomen and face.  Patient is a 31 y.o. male presenting with headaches. The history is provided by the patient.  Headache  Pertinent negatives include no fever, no shortness of breath, no nausea and no vomiting.    Past Medical History  Diagnosis Date  . PTSD (post-traumatic stress disorder)   . Bipolar 1 disorder     Past Surgical History  Procedure Date  . Appendectomy   . Heel spur surgery   . Foot surgery     No family history on file.  History  Substance Use Topics  . Smoking status: Current Every Day Smoker  . Smokeless tobacco: Never Used  . Alcohol Use: 7.2 oz/week    12 Shots of liquor per week      Review of Systems  Constitutional:  Positive for fatigue. Negative for fever.  HENT: Positive for tinnitus. Negative for ear pain, sore throat, rhinorrhea, neck pain and neck stiffness.   Eyes: Negative for photophobia and redness.  Respiratory: Negative for cough and shortness of breath.   Cardiovascular: Negative for chest pain.  Gastrointestinal: Negative for nausea, vomiting, abdominal pain and diarrhea.  Genitourinary: Negative for dysuria.  Musculoskeletal: Negative for myalgias.  Skin: Negative for rash.  Neurological: Positive for headaches.  Hematological: Negative for adenopathy.  Psychiatric/Behavioral: Negative for confusion.    Allergies  Review of patient's allergies indicates no known allergies.  Home Medications   Current Outpatient Rx  Name Route Sig Dispense Refill  . QUETIAPINE FUMARATE 400 MG PO TABS Oral Take 400 mg by mouth at bedtime.      BP 143/84  Pulse 96  Temp 98.2 F (36.8 C) (Oral)  Resp 20  SpO2 98%  Physical Exam  Nursing note and vitals reviewed. Constitutional: He is oriented to person, place, and time. He appears well-developed and well-nourished.  HENT:  Head: Normocephalic and atraumatic.  Right Ear: External ear normal.  Left Ear: External ear normal.       Tenderness to palpation of the scalp over the band-like distribution stretching from ear to ear over the top of his head and on his temples. No cervical or axillary adenopathy.  Eyes: Conjunctivae normal and EOM are normal. Pupils are equal, round, and reactive to light. Right eye exhibits no  discharge. Left eye exhibits no discharge.  Neck: Normal range of motion. Neck supple.  Cardiovascular: Normal rate, regular rhythm and normal heart sounds.   No murmur heard. Pulmonary/Chest: Effort normal and breath sounds normal. He has no wheezes.  Abdominal: Soft. There is no tenderness.  Lymphadenopathy:    He has no cervical adenopathy.    He has no axillary adenopathy.       Right: No supraclavicular adenopathy  present.       Left: No supraclavicular adenopathy present.  Neurological: He is alert and oriented to person, place, and time. He has normal strength. No cranial nerve deficit or sensory deficit. Coordination and gait normal. GCS eye subscore is 4. GCS verbal subscore is 5. GCS motor subscore is 6.  Skin: Skin is warm and dry.  Psychiatric: He has a normal mood and affect.    ED Course  Procedures (including critical care time)  Labs Reviewed - No data to display No results found.   1. Headache     Patient seen and examined. Work-up initiated. Medications ordered.   Vital signs reviewed and are as follows: Filed Vitals:   04/25/12 1529  BP: 136/85  Pulse: 92  Temp:   Resp:   BP 136/85  Pulse 92  Temp 98.2 F (36.8 C) (Oral)  Resp 20  SpO2 100%  Patient given primary care physician referrals. Urged followup regarding his vague symptoms and possible hypertension. Urged patient to return with worsening symptoms or other concerns. Patient verbalizes understanding and agrees with plan.    MDM  Headache: Atypical for patient however it seems tension type. He does have some tenderness to palpation. No concern for meningitis. Patient appears well, nontoxic. He has normal neurological exam. No indications for CT scan today.  Hypertension: Mild elevation in emergency department. Do not suspect any endorgan damage to indicate hypertensive emergency. Patient will need followup with primary care physician for further evaluation and possible treatment.  Do not suspect any emergent medical conditions today.       Renne Crigler, [place] 04/25/12 1550

## 2012-04-28 ENCOUNTER — Emergency Department (HOSPITAL_BASED_OUTPATIENT_CLINIC_OR_DEPARTMENT_OTHER)
Admission: EM | Admit: 2012-04-28 | Discharge: 2012-04-28 | Disposition: A | Discharge status: Home / Self Care | Payer: Self-pay | Attending: Emergency Medicine | Admitting: Emergency Medicine

## 2012-04-28 ENCOUNTER — Encounter (HOSPITAL_BASED_OUTPATIENT_CLINIC_OR_DEPARTMENT_OTHER): Payer: Self-pay | Admitting: Family Medicine

## 2012-04-28 DIAGNOSIS — F4310 Post-traumatic stress disorder, unspecified: Secondary | ICD-10-CM | POA: Insufficient documentation

## 2012-04-28 DIAGNOSIS — I10 Essential (primary) hypertension: Secondary | ICD-10-CM | POA: Insufficient documentation

## 2012-04-28 DIAGNOSIS — F319 Bipolar disorder, unspecified: Secondary | ICD-10-CM | POA: Insufficient documentation

## 2012-04-28 DIAGNOSIS — F17200 Nicotine dependence, unspecified, uncomplicated: Secondary | ICD-10-CM | POA: Insufficient documentation

## 2012-04-28 MED ORDER — LISINOPRIL 20 MG PO TABS: 10.0000 mg | ORAL_TABLET | Freq: Every day | ORAL | Status: DC

## 2012-04-28 NOTE — ED Provider Notes (Signed)
History     CSN: [id number]  Arrival date & time 04/28/12  1146   First MD Initiated Contact with Patient 04/28/12 1248      Chief Complaint  Patient presents with  . Hypertension    (Consider location/radiation/quality/duration/timing/severity/associated sxs/prior treatment) Patient is a 31 y.o. male presenting with hypertension. The history is provided by the patient. No language interpreter was used.  Hypertension This is a recurrent problem. The current episode started today. The problem has been unchanged. Nothing aggravates the symptoms. He has tried nothing for the symptoms. The treatment provided mild relief.  Pt reports facilities continue to get elevated blood pressures on him.   Pt is in [facility] for treatment.  Pt reports frequent headaches  Past Medical History  Diagnosis Date  . PTSD (post-traumatic stress disorder)   . Bipolar 1 disorder     Past Surgical History  Procedure Date  . Appendectomy   . Heel spur surgery   . Foot surgery     No family history on file.  History  Substance Use Topics  . Smoking status: Current Every Day Smoker  . Smokeless tobacco: Never Used  . Alcohol Use: 7.2 oz/week    12 Shots of liquor per week      Review of Systems  All other systems reviewed and are negative.    Allergies  Review of patient's allergies indicates no known allergies.  Home Medications   Current Outpatient Rx  Name Route Sig Dispense Refill  . QUETIAPINE FUMARATE 400 MG PO TABS Oral Take 400 mg by mouth at bedtime.      BP 139/88  Pulse 99  Temp 98.6 F (37 C) (Oral)  Resp 16  SpO2 99%  Physical Exam  Nursing note and vitals reviewed. Constitutional: He is oriented to person, place, and time. He appears well-developed and well-nourished.  HENT:  Head: Normocephalic and atraumatic.  Cardiovascular: Normal rate.   Pulmonary/Chest: Effort normal.  Musculoskeletal: Normal range of motion.  Neurological: He is alert and oriented to  person, place, and time. He has normal reflexes.  Skin: Skin is warm.  Psychiatric: He has a normal mood and affect.  I checked blood pressure manually 130/90  ED Course  Procedures (including critical care time)  Labs Reviewed - No data to display No results found.   No diagnosis found.    MDM  lisinopril        Lonia Skinner [place], [place] 04/28/12 1335

## 2012-04-28 NOTE — ED Provider Notes (Signed)
Medical screening examination/treatment/procedure(s) were performed by non-physician practitioner and as supervising physician I was immediately available for consultation/collaboration.  Leoma Folds, MD 04/28/12 1057 

## 2012-04-28 NOTE — ED Notes (Signed)
Pt is in [facility]. Pt sent here for elevated BP and headache. Pt sts he does not have h/o HTN and does not take meds for same.

## 2012-04-28 NOTE — ED Provider Notes (Signed)
Medical screening examination/treatment/procedure(s) were performed by non-physician practitioner and as supervising physician I was immediately available for consultation/collaboration.   Brek Reece, MD 04/28/12 1729 

## 2012-05-07 ENCOUNTER — Encounter (HOSPITAL_BASED_OUTPATIENT_CLINIC_OR_DEPARTMENT_OTHER): Payer: Self-pay | Admitting: *Deleted

## 2012-05-07 ENCOUNTER — Emergency Department (HOSPITAL_BASED_OUTPATIENT_CLINIC_OR_DEPARTMENT_OTHER): Payer: Self-pay

## 2012-05-07 ENCOUNTER — Emergency Department (HOSPITAL_BASED_OUTPATIENT_CLINIC_OR_DEPARTMENT_OTHER)
Admission: EM | Admit: 2012-05-07 | Discharge: 2012-05-08 | Disposition: A | Discharge status: Home / Self Care | Payer: Self-pay | Attending: Emergency Medicine | Admitting: Emergency Medicine

## 2012-05-07 DIAGNOSIS — I10 Essential (primary) hypertension: Secondary | ICD-10-CM | POA: Insufficient documentation

## 2012-05-07 DIAGNOSIS — F17200 Nicotine dependence, unspecified, uncomplicated: Secondary | ICD-10-CM | POA: Insufficient documentation

## 2012-05-07 DIAGNOSIS — Z79899 Other long term (current) drug therapy: Secondary | ICD-10-CM | POA: Insufficient documentation

## 2012-05-07 DIAGNOSIS — F4310 Post-traumatic stress disorder, unspecified: Secondary | ICD-10-CM | POA: Insufficient documentation

## 2012-05-07 DIAGNOSIS — F319 Bipolar disorder, unspecified: Secondary | ICD-10-CM | POA: Insufficient documentation

## 2012-05-07 DIAGNOSIS — R51 Headache: Secondary | ICD-10-CM | POA: Insufficient documentation

## 2012-05-07 DIAGNOSIS — F1010 Alcohol abuse, uncomplicated: Secondary | ICD-10-CM | POA: Insufficient documentation

## 2012-05-07 DIAGNOSIS — H538 Other visual disturbances: Secondary | ICD-10-CM | POA: Insufficient documentation

## 2012-05-07 HISTORY — DX: Essential (primary) hypertension: I10
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
## 2012-05-08 ENCOUNTER — Emergency Department (HOSPITAL_COMMUNITY)
Admission: EM | Admit: 2012-05-08 | Discharge: 2012-05-08 | Discharge status: Against Medical Advice | Payer: Self-pay | Attending: Emergency Medicine | Admitting: Emergency Medicine

## 2012-05-08 ENCOUNTER — Encounter (HOSPITAL_COMMUNITY): Payer: Self-pay | Admitting: Emergency Medicine

## 2012-05-08 DIAGNOSIS — R079 Chest pain, unspecified: Secondary | ICD-10-CM | POA: Insufficient documentation

## 2012-05-08 LAB — CBC WITH DIFFERENTIAL/PLATELET
Eosinophils Relative: 2 % (ref 0–5)
HCT: 45.9 % (ref 39.0–52.0)
Hemoglobin: 16.2 g/dL (ref 13.0–17.0)
Lymphocytes Relative: 39 % (ref 12–46)
Lymphs Abs: 3.4 10*3/uL (ref 0.7–4.0)
MCV: 84.2 fL (ref 78.0–100.0)
Monocytes Absolute: 0.7 10*3/uL (ref 0.1–1.0)
RBC: 5.45 MIL/uL (ref 4.22–5.81)
WBC: 8.7 10*3/uL (ref 4.0–10.5)

## 2012-05-08 LAB — BASIC METABOLIC PANEL
CO2: 25 mEq/L (ref 19–32)
Calcium: 8.9 mg/dL (ref 8.4–10.5)
Chloride: 101 mEq/L (ref 96–112)
Creatinine, Ser: 1.1 mg/dL (ref 0.50–1.35)
Glucose, Bld: 105 mg/dL — ABNORMAL HIGH (ref 70–99)

## 2012-05-08 LAB — URINE MICROSCOPIC-ADD ON

## 2012-05-08 LAB — URINALYSIS, ROUTINE W REFLEX MICROSCOPIC
Glucose, UA: NEGATIVE mg/dL
Ketones, ur: NEGATIVE mg/dL
Protein, ur: NEGATIVE mg/dL
pH: 6 (ref 5.0–8.0)

## 2012-05-08 MED ORDER — TRAMADOL HCL 50 MG PO TABS
50.0000 mg | ORAL_TABLET | Freq: Once | ORAL | Status: AC
  Administered 2012-05-08: 50 mg via ORAL
  Filled 2012-05-08: qty 1

## 2012-05-08 NOTE — ED Notes (Signed)
Pt presenting to ed with c/o chest pain onset x 1 hour ago. Pt states he's at [facility]. Pt states he also had same symptoms and [facility]. Pt states his blood pressure was elevated today

## 2012-05-08 NOTE — ED Notes (Signed)
Patient transported to CT 

## 2012-05-08 NOTE — ED Provider Notes (Addendum)
History     CSN: [id number]  Arrival date & time 05/07/12  2244   First MD Initiated Contact with Patient 05/07/12 2309      Chief Complaint  Patient presents with  . Blurred Vision  . Tingling    (Consider location/radiation/quality/duration/timing/severity/associated sxs/prior treatment) Patient is a 31 y.o. male presenting with headaches. The history is provided by the patient. No language interpreter was used.  Headache  This is a recurrent problem. The current episode started 3 to 5 hours ago. The problem occurs constantly. The problem has not changed since onset.The headache is associated with nothing. The pain is located in the right unilateral region. The quality of the pain is described as throbbing. The pain is moderate. The pain does not radiate. Pertinent negatives include no anorexia, no fever, no malaise/fatigue, no chest pressure, no near-syncope, no orthopnea, no palpitations, no syncope, no shortness of breath, no nausea and no vomiting. He has tried nothing for the symptoms. The treatment provided no relief.  Says his vision was blurry and this has happened in the past.  Has not seen a PMD or an eye doctor.  Is at [facility].  No changes in speech no weakness.  States symptoms happen periodically.   Past Medical History  Diagnosis Date  . PTSD (post-traumatic stress disorder)   . Bipolar 1 disorder   . Alcohol abuse   . Hypertension     Past Surgical History  Procedure Date  . Appendectomy   . Heel spur surgery   . Foot surgery     No family history on file.  History  Substance Use Topics  . Smoking status: Current Every Day Smoker    Types: Cigarettes  . Smokeless tobacco: Never Used  . Alcohol Use: 7.2 oz/week    12 Shots of liquor per week      Review of Systems  Constitutional: Negative for fever and malaise/fatigue.  Respiratory: Negative for shortness of breath.   Cardiovascular: Negative for palpitations, orthopnea, syncope and near-syncope.    Gastrointestinal: Negative for nausea, vomiting and anorexia.  Neurological: Positive for headaches. Negative for dizziness, seizures, syncope, facial asymmetry, speech difficulty and weakness.  All other systems reviewed and are negative.    Allergies  Review of patient's allergies indicates no known allergies.  Home Medications   Current Outpatient Rx  Name Route Sig Dispense Refill  . ALUM & MAG HYDROXIDE-SIMETH 500-450-40 MG/5ML PO SUSP Oral Take 30 mLs by mouth every 6 (six) hours as needed.    Marland Kitchen DIPHENHYDRAMINE HCL 25 MG PO TABS Oral Take 25 mg by mouth every 6 (six) hours as needed.    . IBUPROFEN 200 MG PO TABS Oral Take 800 mg by mouth every 6 (six) hours as needed.    Marland Kitchen LISINOPRIL 20 MG PO TABS Oral Take 0.5 tablets (10 mg total) by mouth daily. 30 tablet 0  . LOPERAMIDE HCL 2 MG PO CAPS Oral Take 2 mg by mouth 4 (four) times daily as needed.    Marland Kitchen QUETIAPINE FUMARATE 25 MG PO TABS Oral Take 25 mg by mouth 2 (two) times daily.    . QUETIAPINE FUMARATE 400 MG PO TABS Oral Take 500 mg by mouth at bedtime.       BP 149/102  Pulse 103  Temp 97.9 F (36.6 C) (Oral)  Resp 18  Ht 5\' 10"  (1.778 m)  Wt 230 lb (104.327 kg)  BMI 33.00 kg/m2  SpO2 98%  Physical Exam  Constitutional: He is oriented to  person, place, and time. He appears well-developed and well-nourished. No distress.  HENT:  Head: Normocephalic and atraumatic.  Mouth/Throat: Oropharynx is clear and moist. No oropharyngeal exudate.  Eyes: Conjunctivae normal and EOM are normal. Pupils are equal, round, and reactive to light.  Neck: Normal range of motion. Neck supple. No JVD present.  Abdominal: Bowel sounds are normal. He exhibits no distension. There is no tenderness.  Musculoskeletal: Normal range of motion.  Lymphadenopathy:    He has no cervical adenopathy.  Neurological: He is alert and oriented to person, place, and time. He has normal reflexes. He displays normal reflexes. No cranial nerve deficit. He  exhibits normal muscle tone. Coordination normal.       5/5 strength in all   Skin: Skin is warm and dry. He is not diaphoretic.  Psychiatric: His mood appears anxious.    ED Course  Procedures (including critical care time)  Labs Reviewed  BASIC METABOLIC PANEL - Abnormal; Notable for the following:    Glucose, Bld 105 (*)     GFR calc non Af Amer 89 (*)     All other components within normal limits  URINALYSIS, ROUTINE W REFLEX MICROSCOPIC - Abnormal; Notable for the following:    Hgb urine dipstick TRACE (*)     All other components within normal limits  CBC WITH DIFFERENTIAL  URINE MICROSCOPIC-ADD ON   Dg Chest 2 View  05/08/2012  *RADIOLOGY REPORT*  Clinical Data: 31 year old male blurred vision, headache, hypertension.  CHEST - 2 VIEW  Comparison: 05/28/2010.  Findings: Slightly lower lung volumes.  Stable mild elevation of the right hemidiaphragm.  Cardiac size and mediastinal contours are within normal limits.  Visualized tracheal air column is within normal limits.  Mild perihilar atelectasis, otherwise the lungs are clear.  No pneumothorax or effusion. No acute osseous abnormality identified.  IMPRESSION: Mild atelectasis, otherwise no acute cardiopulmonary abnormality.   Original Report Authenticated By: Harley Hallmark, M.D.    Ct Head Wo Contrast  05/08/2012  *RADIOLOGY REPORT*  Clinical Data: Headache, history of alcohol abuse  CT HEAD WITHOUT CONTRAST  Technique:  Contiguous axial images were obtained from the base of the skull through the vertex without contrast.  Comparison: None.  Findings:  Wallace Cullens white differentiation is maintained.  No CT evidence of acute large territory infarct.  No intraparenchymal or extra-axial mass or hemorrhage.  Normal size and configuration of the ventricles and the basilar cisterns.  No midline shift.  Paranasal sinuses and mastoid air cells are normally aerated.  Regional soft tissues are normal.  No displaced calvarial fracture.  IMPRESSION:  Negative noncontrast head CT.   Original Report Authenticated By: Waynard Reeds, M.D.      No diagnosis found.    MDM  No weakness nor numbness on exam.  Frequent visits for same.  Will need to follow up with PMD and optholmologist for dilated eye exam.  Return for weakness numbness or changes in speech.  Follow up with neurology and optho.  Return for worsening symptoms patient verbalizes understanding and agrees to same       Frayda Egley K Trish Mancinelli-Rasch, MD 05/08/12 0133  Hawken Bielby K Sylvan Lahm-Rasch, MD 05/08/12 [phone]

## 2012-05-22 ENCOUNTER — Emergency Department (HOSPITAL_BASED_OUTPATIENT_CLINIC_OR_DEPARTMENT_OTHER)
Admission: EM | Admit: 2012-05-22 | Discharge: 2012-05-22 | Disposition: A | Discharge status: Home / Self Care | Payer: Self-pay | Attending: Emergency Medicine | Admitting: Emergency Medicine

## 2012-05-22 ENCOUNTER — Encounter (HOSPITAL_BASED_OUTPATIENT_CLINIC_OR_DEPARTMENT_OTHER): Payer: Self-pay

## 2012-05-22 DIAGNOSIS — Z9889 Other specified postprocedural states: Secondary | ICD-10-CM | POA: Insufficient documentation

## 2012-05-22 DIAGNOSIS — Z8659 Personal history of other mental and behavioral disorders: Secondary | ICD-10-CM | POA: Insufficient documentation

## 2012-05-22 DIAGNOSIS — F1010 Alcohol abuse, uncomplicated: Secondary | ICD-10-CM | POA: Insufficient documentation

## 2012-05-22 DIAGNOSIS — J039 Acute tonsillitis, unspecified: Secondary | ICD-10-CM

## 2012-05-22 DIAGNOSIS — Z79899 Other long term (current) drug therapy: Secondary | ICD-10-CM | POA: Insufficient documentation

## 2012-05-22 DIAGNOSIS — F319 Bipolar disorder, unspecified: Secondary | ICD-10-CM | POA: Insufficient documentation

## 2012-05-22 DIAGNOSIS — I10 Essential (primary) hypertension: Secondary | ICD-10-CM | POA: Insufficient documentation

## 2012-05-22 DIAGNOSIS — F17200 Nicotine dependence, unspecified, uncomplicated: Secondary | ICD-10-CM | POA: Insufficient documentation

## 2012-05-22 LAB — CBC WITH DIFFERENTIAL/PLATELET
Basophils Absolute: 0 10*3/uL (ref 0.0–0.1)
Basophils Relative: 0 % (ref 0–1)
Eosinophils Absolute: 0 10*3/uL (ref 0.0–0.7)
Hemoglobin: 16.5 g/dL (ref 13.0–17.0)
MCHC: 35.2 g/dL (ref 30.0–36.0)
Monocytes Relative: 12 % (ref 3–12)
Neutro Abs: 10.1 10*3/uL — ABNORMAL HIGH (ref 1.7–7.7)
Neutrophils Relative %: 75 % (ref 43–77)
Platelets: 166 10*3/uL (ref 150–400)

## 2012-05-22 LAB — MONONUCLEOSIS SCREEN: Mono Screen: NEGATIVE

## 2012-05-22 LAB — RAPID STREP SCREEN (MED CTR MEBANE ONLY): Streptococcus, Group A Screen (Direct): NEGATIVE

## 2012-05-22 MED ORDER — SODIUM CHLORIDE 0.9 % IV SOLN
Freq: Once | INTRAVENOUS | Status: AC
  Administered 2012-05-22: 1000 mL via INTRAVENOUS

## 2012-05-22 MED ORDER — PENICILLIN V POTASSIUM 500 MG PO TABS: 500.0000 mg | ORAL_TABLET | Freq: Four times a day (QID) | ORAL | Status: AC

## 2012-05-22 MED ORDER — ACETAMINOPHEN 325 MG PO TABS
650.0000 mg | ORAL_TABLET | ORAL | Status: DC | PRN
  Administered 2012-05-22: 650 mg via ORAL
  Filled 2012-05-22: qty 2

## 2012-05-22 MED ORDER — DEXTROSE 5 % IV SOLN
1.0000 g | INTRAVENOUS | Status: DC
  Administered 2012-05-22: 1 g via INTRAVENOUS
  Filled 2012-05-22: qty 10

## 2012-05-22 NOTE — ED Notes (Signed)
Sore throat, bilat ear aches x 4 days

## 2012-05-22 NOTE — ED Provider Notes (Signed)
History     CSN: [id number]  Arrival date & time 05/22/12  1300   First MD Initiated Contact with Patient 05/22/12 1316      Chief Complaint  Patient presents with  . Sore Throat    (Consider location/radiation/quality/duration/timing/severity/associated sxs/prior treatment) Patient is a 31 y.o. male presenting with pharyngitis. The history is provided by the patient. No language interpreter was used.  Sore Throat This is a new problem. The current episode started today. The problem occurs constantly. The problem has been gradually worsening. Associated symptoms include a sore throat. Nothing aggravates the symptoms. He has tried nothing for the symptoms. The treatment provided moderate relief.  Pt complains of a sore throat and fever.   Pt reports he has pain into both ears  Past Medical History  Diagnosis Date  . PTSD (post-traumatic stress disorder)   . Bipolar 1 disorder   . Alcohol abuse   . Hypertension     Past Surgical History  Procedure Date  . Appendectomy   . Heel spur surgery   . Foot surgery     No family history on file.  History  Substance Use Topics  . Smoking status: Current Every Day Smoker -- 1.0 packs/day    Types: Cigarettes  . Smokeless tobacco: Never Used  . Alcohol Use: No      Review of Systems  HENT: Positive for sore throat.   All other systems reviewed and are negative.    Allergies  Review of patient's allergies indicates no known allergies.  Home Medications   Current Outpatient Rx  Name Route Sig Dispense Refill  . ALUM & MAG HYDROXIDE-SIMETH 500-450-40 MG/5ML PO SUSP Oral Take 30 mLs by mouth every 6 (six) hours as needed. For indigestion.    Marland Kitchen LISINOPRIL 20 MG PO TABS Oral Take 0.5 tablets (10 mg total) by mouth daily. 30 tablet 0  . QUETIAPINE FUMARATE 25 MG PO TABS Oral Take 25 mg by mouth 2 (two) times daily.    . QUETIAPINE FUMARATE 400 MG PO TABS Oral Take 400 mg by mouth at bedtime.       BP 148/88  Pulse 112   Temp 100 F (37.8 C)  Resp 16  Ht 5\' 10"  (1.778 m)  Wt 221 lb (100.245 kg)  BMI 31.71 kg/m2  SpO2 98%  Physical Exam  Nursing note and vitals reviewed. Constitutional: He is oriented to person, place, and time. He appears well-developed.  HENT:  Head: Normocephalic and atraumatic.  Eyes: Conjunctivae normal and EOM are normal. Pupils are equal, round, and reactive to light.  Neck: Normal range of motion. Neck supple.  Cardiovascular: Normal rate and normal heart sounds.   Pulmonary/Chest: Effort normal.  Abdominal: Soft.  Musculoskeletal: Normal range of motion.  Neurological: He is alert and oriented to person, place, and time.  Skin: Skin is warm.    ED Course  Procedures (including critical care time)   Labs Reviewed  RAPID STREP SCREEN   No results found.   No diagnosis found.  Mono negative, strep negative.   Pt given Iv fluids and IV rocephin  MDM  Pcn vk 500 40 qid        Lonia Skinner [place], [place] 05/22/12 1554

## 2012-05-23 NOTE — ED Provider Notes (Signed)
Medical screening examination/treatment/procedure(s) were performed by non-physician practitioner and as supervising physician I was immediately available for consultation/collaboration.   Shelda Jakes, MD 05/23/12 1323

## 2012-05-26 ENCOUNTER — Encounter (HOSPITAL_BASED_OUTPATIENT_CLINIC_OR_DEPARTMENT_OTHER): Payer: Self-pay | Admitting: *Deleted

## 2012-05-26 ENCOUNTER — Emergency Department (HOSPITAL_BASED_OUTPATIENT_CLINIC_OR_DEPARTMENT_OTHER)
Admission: EM | Admit: 2012-05-26 | Discharge: 2012-05-26 | Disposition: A | Discharge status: Home / Self Care | Payer: Self-pay | Attending: Emergency Medicine | Admitting: Emergency Medicine

## 2012-05-26 DIAGNOSIS — Z792 Long term (current) use of antibiotics: Secondary | ICD-10-CM | POA: Insufficient documentation

## 2012-05-26 DIAGNOSIS — F4310 Post-traumatic stress disorder, unspecified: Secondary | ICD-10-CM | POA: Insufficient documentation

## 2012-05-26 DIAGNOSIS — F319 Bipolar disorder, unspecified: Secondary | ICD-10-CM | POA: Insufficient documentation

## 2012-05-26 DIAGNOSIS — F17200 Nicotine dependence, unspecified, uncomplicated: Secondary | ICD-10-CM | POA: Insufficient documentation

## 2012-05-26 DIAGNOSIS — J039 Acute tonsillitis, unspecified: Secondary | ICD-10-CM | POA: Insufficient documentation

## 2012-05-26 DIAGNOSIS — Z79899 Other long term (current) drug therapy: Secondary | ICD-10-CM | POA: Insufficient documentation

## 2012-05-26 DIAGNOSIS — I10 Essential (primary) hypertension: Secondary | ICD-10-CM | POA: Insufficient documentation

## 2012-05-26 DIAGNOSIS — F1010 Alcohol abuse, uncomplicated: Secondary | ICD-10-CM | POA: Insufficient documentation

## 2012-05-26 LAB — CBC WITH DIFFERENTIAL/PLATELET
Eosinophils Absolute: 0.2 10*3/uL (ref 0.0–0.7)
Eosinophils Relative: 2 % (ref 0–5)
Lymphocytes Relative: 26 % (ref 12–46)
Lymphs Abs: 2.9 10*3/uL (ref 0.7–4.0)
Monocytes Absolute: 0.8 10*3/uL (ref 0.1–1.0)
Monocytes Relative: 7 % (ref 3–12)
Neutrophils Relative %: 55 % (ref 43–77)
Platelets: 211 10*3/uL (ref 150–400)
RBC: 5.61 MIL/uL (ref 4.22–5.81)
RDW: 12.8 % (ref 11.5–15.5)
WBC: 11.2 10*3/uL — ABNORMAL HIGH (ref 4.0–10.5)

## 2012-05-26 LAB — BASIC METABOLIC PANEL
CO2: 28 mEq/L (ref 19–32)
Calcium: 9.3 mg/dL (ref 8.4–10.5)
Creatinine, Ser: 1.2 mg/dL (ref 0.50–1.35)
GFR calc non Af Amer: 80 mL/min — ABNORMAL LOW (ref >90)
Sodium: 139 mEq/L (ref 135–145)

## 2012-05-26 MED ORDER — LIDOCAINE VISCOUS 2 % MT SOLN: 20.0000 mL | OROMUCOSAL | Status: DC | PRN

## 2012-05-26 MED ORDER — LIDOCAINE VISCOUS 2 % MT SOLN
OROMUCOSAL | Status: AC
  Administered 2012-05-26: 20 mL via OROMUCOSAL
  Filled 2012-05-26: qty 15

## 2012-05-26 MED ORDER — LIDOCAINE VISCOUS 2 % MT SOLN
20.0000 mL | Freq: Once | OROMUCOSAL | Status: AC
  Administered 2012-05-26: 20 mL via OROMUCOSAL

## 2012-05-26 MED ORDER — METHYLPREDNISOLONE SODIUM SUCC 125 MG IJ SOLR
125.0000 mg | Freq: Once | INTRAMUSCULAR | Status: AC
  Administered 2012-05-26: 125 mg via INTRAVENOUS
  Filled 2012-05-26: qty 2

## 2012-05-26 MED ORDER — KETOROLAC TROMETHAMINE 30 MG/ML IJ SOLN
30.0000 mg | Freq: Once | INTRAMUSCULAR | Status: AC
  Administered 2012-05-26: 30 mg via INTRAVENOUS
  Filled 2012-05-26: qty 1

## 2012-05-26 MED ORDER — TRAMADOL HCL 50 MG PO TABS: 50.0000 mg | ORAL_TABLET | Freq: Four times a day (QID) | ORAL | Status: DC | PRN

## 2012-05-26 MED ORDER — SODIUM CHLORIDE 0.9 % IV SOLN
Freq: Once | INTRAVENOUS | Status: AC
  Administered 2012-05-26: 13:00:00 via INTRAVENOUS

## 2012-05-26 MED ORDER — TRAMADOL HCL 50 MG PO TABS
50.0000 mg | ORAL_TABLET | Freq: Once | ORAL | Status: AC
  Administered 2012-05-26: 50 mg via ORAL
  Filled 2012-05-26: qty 1

## 2012-05-26 MED ORDER — DEXTROSE 5 % IV SOLN
1.0000 g | INTRAVENOUS | Status: DC
  Administered 2012-05-26: 1 g via INTRAVENOUS
  Filled 2012-05-26: qty 10

## 2012-05-26 NOTE — ED Provider Notes (Signed)
History     CSN: [id number]  Arrival date & time 05/26/12  1116   First MD Initiated Contact with Patient 05/26/12 1217      Chief Complaint  Patient presents with  . Otalgia    (Consider location/radiation/quality/duration/timing/severity/associated sxs/prior treatment) Patient is a 31 y.o. male presenting with pharyngitis. The history is provided by the patient. No language interpreter was used.  Sore Throat This is a new problem. The current episode started today. The problem occurs constantly. The problem has been gradually worsening. Associated symptoms include a sore throat. Exacerbated by: swallowing. He has tried nothing for the symptoms. The treatment provided no relief.  Pt complains of a continued sore throat.  No improvement with pcn.   Pt reports difficulty swallowing  Past Medical History  Diagnosis Date  . PTSD (post-traumatic stress disorder)   . Bipolar 1 disorder   . Alcohol abuse   . Hypertension     Past Surgical History  Procedure Date  . Appendectomy   . Heel spur surgery   . Foot surgery     No family history on file.  History  Substance Use Topics  . Smoking status: Current Every Day Smoker -- 1.0 packs/day    Types: Cigarettes  . Smokeless tobacco: Never Used  . Alcohol Use: No      Review of Systems  HENT: Positive for sore throat.   All other systems reviewed and are negative.    Allergies  Review of patient's allergies indicates no known allergies.  Home Medications   Current Outpatient Rx  Name Route Sig Dispense Refill  . ALUM & MAG HYDROXIDE-SIMETH 500-450-40 MG/5ML PO SUSP Oral Take 30 mLs by mouth every 6 (six) hours as needed. For indigestion.    Marland Kitchen LISINOPRIL 20 MG PO TABS Oral Take 0.5 tablets (10 mg total) by mouth daily. 30 tablet 0  . PENICILLIN V POTASSIUM 500 MG PO TABS Oral Take 1 tablet (500 mg total) by mouth 4 (four) times daily. 40 tablet 0  . QUETIAPINE FUMARATE 25 MG PO TABS Oral Take 25 mg by mouth 2 (two)  times daily.    . QUETIAPINE FUMARATE 400 MG PO TABS Oral Take 400 mg by mouth at bedtime.       BP 139/93  Pulse 101  Temp 98.4 F (36.9 C) (Oral)  Resp 18  Ht 5\' 10"  (1.778 m)  Wt 220 lb (99.791 kg)  BMI 31.57 kg/m2  SpO2 98%  Physical Exam  Nursing note and vitals reviewed. Constitutional: He appears well-developed and well-nourished.  HENT:  Head: Normocephalic and atraumatic.  Right Ear: External ear normal.  Left Ear: External ear normal.       Swollen exudative tonsils  Eyes: Conjunctivae normal and EOM are normal. Pupils are equal, round, and reactive to light.  Neck: Normal range of motion. Neck supple.  Cardiovascular: Normal rate.   Pulmonary/Chest: Effort normal.  Abdominal: Soft.  Musculoskeletal: Normal range of motion.  Neurological: He is alert.  Skin: Skin is warm.  Psychiatric: He has a normal mood and affect.    ED Course  Procedures (including critical care time)  Labs Reviewed  CBC WITH DIFFERENTIAL - Abnormal; Notable for the following:    WBC 11.2 (*)     All other components within normal limits  BASIC METABOLIC PANEL - Abnormal; Notable for the following:    Glucose, Bld 108 (*)     GFR calc non Af Amer 80 (*)     All other  components within normal limits   No results found.   No diagnosis found.    MDM  Throat culture ordered.   Pt given iv fluids, solumedrol and torodol.    I suspect viral process.  Pt advised return here tomorrow for recheck.   Pt given rx for tramadol        Lonia Skinner [place], [place] 05/26/12 1437

## 2012-05-26 NOTE — ED Notes (Signed)
Was seen here 4 days ago for sore throat and bilateral ear pain. Has been taking PCN Tylenol and Ibuprofen as Rx. Here today with worsened ear pain. Feels like his throat is getting a little better. Unable to eat.

## 2012-05-27 LAB — STREP A DNA PROBE

## 2012-05-27 NOTE — ED Provider Notes (Signed)
Medical screening examination/treatment/procedure(s) were conducted as a shared visit with non-physician practitioner(s) and myself.  I personally evaluated the patient during the encounter Patient with evidence of pharyngitis on exam. His strep screen and monoscreen prior were negative. Patient is in no distress but was given Solu-Medrol to help with pain. He was also given Viscous Xylocaine. Throat culture sent  Gwyneth Sprout, MD 05/27/12 1130

## 2012-09-25 ENCOUNTER — Emergency Department (HOSPITAL_BASED_OUTPATIENT_CLINIC_OR_DEPARTMENT_OTHER)
Admission: EM | Admit: 2012-09-25 | Discharge: 2012-09-25 | Disposition: A | Discharge status: Home / Self Care | Payer: Self-pay | Attending: Emergency Medicine | Admitting: Emergency Medicine

## 2012-09-25 ENCOUNTER — Encounter (HOSPITAL_BASED_OUTPATIENT_CLINIC_OR_DEPARTMENT_OTHER): Payer: Self-pay | Admitting: Emergency Medicine

## 2012-09-25 DIAGNOSIS — F17200 Nicotine dependence, unspecified, uncomplicated: Secondary | ICD-10-CM | POA: Insufficient documentation

## 2012-09-25 DIAGNOSIS — Z79899 Other long term (current) drug therapy: Secondary | ICD-10-CM | POA: Insufficient documentation

## 2012-09-25 DIAGNOSIS — T6391XA Toxic effect of contact with unspecified venomous animal, accidental (unintentional), initial encounter: Secondary | ICD-10-CM | POA: Insufficient documentation

## 2012-09-25 DIAGNOSIS — R110 Nausea: Secondary | ICD-10-CM | POA: Insufficient documentation

## 2012-09-25 DIAGNOSIS — T63391A Toxic effect of venom of other spider, accidental (unintentional), initial encounter: Secondary | ICD-10-CM | POA: Insufficient documentation

## 2012-09-25 DIAGNOSIS — I10 Essential (primary) hypertension: Secondary | ICD-10-CM | POA: Insufficient documentation

## 2012-09-25 DIAGNOSIS — Y939 Activity, unspecified: Secondary | ICD-10-CM | POA: Insufficient documentation

## 2012-09-25 DIAGNOSIS — F319 Bipolar disorder, unspecified: Secondary | ICD-10-CM | POA: Insufficient documentation

## 2012-09-25 DIAGNOSIS — Y929 Unspecified place or not applicable: Secondary | ICD-10-CM | POA: Insufficient documentation

## 2012-09-25 DIAGNOSIS — F4310 Post-traumatic stress disorder, unspecified: Secondary | ICD-10-CM | POA: Insufficient documentation

## 2012-09-25 MED ORDER — ONDANSETRON 4 MG PO TBDP
4.0000 mg | ORAL_TABLET | Freq: Once | ORAL | Status: AC
  Administered 2012-09-25: 4 mg via ORAL
  Filled 2012-09-25: qty 1

## 2012-09-25 MED ORDER — HYDROCODONE-ACETAMINOPHEN 5-325 MG PO TABS
1.0000 | ORAL_TABLET | Freq: Once | ORAL | Status: AC
  Administered 2012-09-25: 1 via ORAL
  Filled 2012-09-25: qty 1

## 2012-09-25 MED ORDER — HYDROCODONE-ACETAMINOPHEN 5-325 MG PO TABS: 1.0000 | ORAL_TABLET | Freq: Four times a day (QID) | ORAL | Status: DC | PRN

## 2012-09-25 MED ORDER — PREDNISONE 20 MG PO TABS: 40.0000 mg | ORAL_TABLET | Freq: Every day | ORAL | Status: DC

## 2012-09-25 MED ORDER — DIPHENHYDRAMINE HCL 25 MG PO CAPS
50.0000 mg | ORAL_CAPSULE | Freq: Once | ORAL | Status: AC
  Administered 2012-09-25: 50 mg via ORAL
  Filled 2012-09-25: qty 2

## 2012-09-25 NOTE — ED Provider Notes (Signed)
History     CSN: [id number]  Arrival date & time 09/25/12  1046   First MD Initiated Contact with Patient 09/25/12 1126      Chief Complaint  Patient presents with  . Insect Bite    (Consider location/radiation/quality/duration/timing/severity/associated sxs/prior treatment) HPI Comments: 32 y.o PMH PTSD, bipolar, etOH abuse, HTN.  He presents for a "spider bite" that happened at 3 am right knee.  He woke up to the spider biting him which he describes a a small brown spider but he did not save the spider.  Location right knee.  Pain is 8/10 throbbing.  He has pain with ROM but no restricted ROM.  He has had increased redness, swelling, pain in the right knee area.  He took a Oxycodone 10 mg given to him by his mother this am for pain around 8 am that did not help.  He cleaned the area with peroxide, neosporin.  Meds: Seroquel    The history is provided by the patient. No language interpreter was used.    Past Medical History  Diagnosis Date  . PTSD (post-traumatic stress disorder)   . Bipolar 1 disorder   . Alcohol abuse   . Hypertension     Past Surgical History  Procedure Laterality Date  . Appendectomy    . Heel spur surgery    . Foot surgery      History reviewed. No pertinent family history.  History  Substance Use Topics  . Smoking status: Current Every Day Smoker -- 1.00 packs/day    Types: Cigarettes  . Smokeless tobacco: Never Used  . Alcohol Use: No      Review of Systems  Constitutional: Negative for fever and chills.  Respiratory: Negative for shortness of breath.   Cardiovascular: Negative for chest pain.  Gastrointestinal: Positive for nausea. Negative for vomiting, abdominal pain and diarrhea.  Skin: Positive for wound.  All other systems reviewed and are negative.    Allergies  Review of patient's allergies indicates no known allergies.  Home Medications   Current Outpatient Rx  Name  Route  Sig  Dispense  Refill  . aluminum &  magnesium hydroxide-simethicone (MYLANTA) 500-450-40 MG/5ML suspension   Oral   Take 30 mLs by mouth every 6 (six) hours as needed. For indigestion.         . QUEtiapine (SEROQUEL) 25 MG tablet   Oral   Take 25 mg by mouth 2 (two) times daily.         . QUEtiapine (SEROQUEL) 400 MG tablet   Oral   Take 400 mg by mouth at bedtime.          Marland Kitchen HYDROcodone-acetaminophen (NORCO) 5-325 MG per tablet   Oral   Take 1 tablet by mouth every 6 (six) hours as needed for pain.   30 tablet   0   . lidocaine (XYLOCAINE) 2 % solution   Oral   Take 20 mLs by mouth as needed for pain.   100 mL   0   . lisinopril (PRINIVIL,ZESTRIL) 20 MG tablet   Oral   Take 0.5 tablets (10 mg total) by mouth daily.   30 tablet   0   . predniSONE (DELTASONE) 20 MG tablet   Oral   Take 2 tablets (40 mg total) by mouth daily.   10 tablet   0   . traMADol (ULTRAM) 50 MG tablet   Oral   Take 1 tablet (50 mg total) by mouth every 6 (six) hours as  needed for pain.   15 tablet   0     BP 149/90  Pulse 102  Temp(Src) 98.3 F (36.8 C) (Oral)  Resp 18  Ht 5\' 10"  (1.778 m)  Wt 210 lb (95.255 kg)  BMI 30.13 kg/m2  SpO2 100%  Physical Exam  Nursing note and vitals reviewed. Constitutional: He is oriented to person, place, and time. He appears well-developed and well-nourished. He is cooperative.  HENT:  Head: Normocephalic and atraumatic.  Mouth/Throat: Oropharynx is clear and moist and mucous membranes are normal. No oropharyngeal exudate.  Eyes: Conjunctivae are normal. Pupils are equal, round, and reactive to light. Right eye exhibits no discharge. Left eye exhibits no discharge. No scleral icterus.  Cardiovascular: Regular rhythm, S1 normal, S2 normal and normal heart sounds.  Tachycardia present.   No murmur heard. Pulmonary/Chest: Effort normal and breath sounds normal.  Abdominal: Soft. Bowel sounds are normal. He exhibits no distension. There is no tenderness.  Obese ab    Musculoskeletal: He exhibits no edema.  Full ROM right lower extremity   Neurological: He is alert and oriented to person, place, and time.  Skin: Skin is warm and dry. No rash noted.     Area around area with adhesive tape that looks older than 8 hours though patient states this happened 3 AM today  Psychiatric: He has a normal mood and affect. His speech is normal and behavior is normal. Judgment and thought content normal. Cognition and memory are normal.    ED Course  Procedures (including critical care time)  Labs Reviewed - No data to display No results found.   1. Spider bite, initial encounter       MDM  Benadryl 50 mg x 1, Prednisone 40 mg qd x 5 days, Norco/Vicodin Return if worsening  Shirlee Latch MD [phone]         Annett Gula, MD 09/25/12 1149

## 2012-09-25 NOTE — ED Notes (Signed)
Pt woke up last night at 0300 when he was bitten by a small brown spider on the top of his right knee.  Right knee is swelling and red.  Pt did take oxycodone 10mg  at 19m for pain.

## 2012-09-25 NOTE — ED Provider Notes (Signed)
I saw and evaluated the patient, reviewed the resident's note and I agree with the findings and plan. Pt with spider bite that occurred today as he saw the spider biting him.  Erythema, warm to the knee.  No joint issue present. Feel most likely allergic given sx started directly after spider biting him.  Given allergic meds and to f/u if worsening.  Gwyneth Sprout, MD 09/25/12 [phone]

## 2012-09-27 ENCOUNTER — Emergency Department (HOSPITAL_COMMUNITY)
Admission: EM | Admit: 2012-09-27 | Discharge: 2012-09-27 | Discharge status: Against Medical Advice | Payer: Self-pay | Attending: Emergency Medicine | Admitting: Emergency Medicine

## 2012-09-27 ENCOUNTER — Emergency Department (HOSPITAL_BASED_OUTPATIENT_CLINIC_OR_DEPARTMENT_OTHER)
Admission: EM | Admit: 2012-09-27 | Discharge: 2012-09-27 | Disposition: A | Discharge status: Home / Self Care | Payer: Self-pay | Attending: Emergency Medicine | Admitting: Emergency Medicine

## 2012-09-27 ENCOUNTER — Encounter (HOSPITAL_BASED_OUTPATIENT_CLINIC_OR_DEPARTMENT_OTHER): Payer: Self-pay | Admitting: Emergency Medicine

## 2012-09-27 DIAGNOSIS — Z79899 Other long term (current) drug therapy: Secondary | ICD-10-CM | POA: Insufficient documentation

## 2012-09-27 DIAGNOSIS — Z8659 Personal history of other mental and behavioral disorders: Secondary | ICD-10-CM | POA: Insufficient documentation

## 2012-09-27 DIAGNOSIS — Y929 Unspecified place or not applicable: Secondary | ICD-10-CM | POA: Insufficient documentation

## 2012-09-27 DIAGNOSIS — Y9389 Activity, other specified: Secondary | ICD-10-CM | POA: Insufficient documentation

## 2012-09-27 DIAGNOSIS — I10 Essential (primary) hypertension: Secondary | ICD-10-CM | POA: Insufficient documentation

## 2012-09-27 DIAGNOSIS — F319 Bipolar disorder, unspecified: Secondary | ICD-10-CM | POA: Insufficient documentation

## 2012-09-27 DIAGNOSIS — F17200 Nicotine dependence, unspecified, uncomplicated: Secondary | ICD-10-CM | POA: Insufficient documentation

## 2012-09-27 DIAGNOSIS — Z5320 Procedure and treatment not carried out because of patient's decision for unspecified reasons: Secondary | ICD-10-CM | POA: Insufficient documentation

## 2012-09-27 DIAGNOSIS — L089 Local infection of the skin and subcutaneous tissue, unspecified: Secondary | ICD-10-CM | POA: Insufficient documentation

## 2012-09-27 MED ORDER — CEPHALEXIN 500 MG PO CAPS: 500.0000 mg | ORAL_CAPSULE | Freq: Four times a day (QID) | ORAL | Status: DC

## 2012-09-27 NOTE — ED Notes (Signed)
Pt here for wound check from insect bite.  Noted redness, swelling and inflammation at site.  Was seen previously for same.

## 2012-09-27 NOTE — ED Provider Notes (Signed)
History     CSN: [id number]  Arrival date & time 09/27/12  1505   First MD Initiated Contact with Patient 09/27/12 1535      Chief Complaint  Patient presents with  . Insect Bite    (Consider location/radiation/quality/duration/timing/severity/associated sxs/prior Treatment)  HPI Ernest Escobar is a 32 y.o. male who presents to the ED for check of a wound from a spider bite.  He complains of redness, swelling and tenderness at the site. Was evaluated for same on a few days ago and given Benadryl and Prednisone. The area has gotten worse and now there is a small amount of yellow drainage from the wound. The history was provided by the patient.  Past Medical History  Diagnosis Date  . PTSD (post-traumatic stress disorder)   . Bipolar 1 disorder   . Alcohol abuse   . Hypertension     Past Surgical History  Procedure Laterality Date  . Appendectomy    . Heel spur surgery    . Foot surgery      No family history on file.  History  Substance Use Topics  . Smoking status: Current Every Day Smoker -- 1.00 packs/day    Types: Cigarettes  . Smokeless tobacco: Never Used  . Alcohol Use: No      Review of Systems  Constitutional: Negative for fever and chills.  Respiratory: Negative for cough.   Cardiovascular: Negative for chest pain.  Gastrointestinal: Negative for nausea and vomiting.  Musculoskeletal: Negative for myalgias.  Skin: Positive for wound.  Allergic/Immunologic: Negative for immunocompromised state.  Neurological: Negative for headaches.  Psychiatric/Behavioral: Negative for confusion. The patient is not nervous/anxious.     Allergies  Review of patient's allergies indicates no known allergies.  Home Medications   Current Outpatient Rx  Name  Route  Sig  Dispense  Refill  . aluminum & magnesium hydroxide-simethicone (MYLANTA) 500-450-40 MG/5ML suspension   Oral   Take 30 mLs by mouth every 6 (six) hours as needed. For indigestion.         Marland Kitchen  HYDROcodone-acetaminophen (NORCO) 5-325 MG per tablet   Oral   Take 1 tablet by mouth every 6 (six) hours as needed for pain.   30 tablet   0   . lidocaine (XYLOCAINE) 2 % solution   Oral   Take 20 mLs by mouth as needed for pain.   100 mL   0   . lisinopril (PRINIVIL,ZESTRIL) 20 MG tablet   Oral   Take 0.5 tablets (10 mg total) by mouth daily.   30 tablet   0   . predniSONE (DELTASONE) 20 MG tablet   Oral   Take 2 tablets (40 mg total) by mouth daily.   10 tablet   0   . QUEtiapine (SEROQUEL) 25 MG tablet   Oral   Take 25 mg by mouth 2 (two) times daily.         . QUEtiapine (SEROQUEL) 400 MG tablet   Oral   Take 400 mg by mouth at bedtime.          . traMADol (ULTRAM) 50 MG tablet   Oral   Take 1 tablet (50 mg total) by mouth every 6 (six) hours as needed for pain.   15 tablet   0     BP 139/75  Pulse 91  Temp(Src) 98 F (36.7 C) (Oral)  Resp 16  Ht 5\' 10"  (1.778 m)  Wt 210 lb (95.255 kg)  BMI 30.13 kg/m2  SpO2  100%  Physical Exam  Nursing note and vitals reviewed. Constitutional: He is oriented to person, place, and time. He appears well-developed and well-nourished. No distress.  HENT:  Head: Normocephalic and atraumatic.  Eyes: EOM are normal.  Neck: Neck supple.  Cardiovascular: Normal rate.   Pulmonary/Chest: Effort normal.  Musculoskeletal:  Raised pustular area right knee with erythema and tenderness on exam.  Neurological: He is alert and oriented to person, place, and time. No cranial nerve deficit.  Skin:  Insect bite with infection right knee.  Psychiatric: He has a normal mood and affect. His behavior is normal. Judgment and thought content normal.   Assessment: 32 y.o. male with infected insect bite  Plan:  Antibiotics   Follow up with PCP Discussed with the patient and all questioned fully answered. He will return if any problems arise.    Medication List    TAKE these medications       cephALEXin 500 MG capsule   Commonly known as:  KEFLEX  Take 1 capsule (500 mg total) by mouth 4 (four) times daily.      ASK your doctor about these medications       aluminum & magnesium hydroxide-simethicone 500-450-40 MG/5ML suspension  Commonly known as:  MYLANTA  Take 30 mLs by mouth every 6 (six) hours as needed. For indigestion.     HYDROcodone-acetaminophen 5-325 MG per tablet  Commonly known as:  NORCO  Take 1 tablet by mouth every 6 (six) hours as needed for pain.     lidocaine 2 % solution  Commonly known as:  XYLOCAINE  Take 20 mLs by mouth as needed for pain.     lisinopril 20 MG tablet  Commonly known as:  PRINIVIL,ZESTRIL  Take 0.5 tablets (10 mg total) by mouth daily.     predniSONE 20 MG tablet  Commonly known as:  DELTASONE  Take 2 tablets (40 mg total) by mouth daily.     SEROQUEL 400 MG tablet  Generic drug:  QUEtiapine  Take 400 mg by mouth at bedtime.     QUEtiapine 25 MG tablet  Commonly known as:  SEROQUEL  Take 25 mg by mouth 2 (two) times daily.     traMADol 50 MG tablet  Commonly known as:  ULTRAM  Take 1 tablet (50 mg total) by mouth every 6 (six) hours as needed for pain.        ED Course  Procedures        [facility], NP 09/29/12 1048

## 2012-09-29 NOTE — ED Provider Notes (Signed)
Medical screening examination/treatment/procedure(s) were performed by non-physician practitioner and as supervising physician I was immediately available for consultation/collaboration.   Charles B. Sheldon, MD 09/29/12 1050 

## 2012-10-01 ENCOUNTER — Encounter (HOSPITAL_COMMUNITY): Payer: Self-pay | Admitting: Emergency Medicine

## 2012-10-01 ENCOUNTER — Emergency Department (HOSPITAL_COMMUNITY)
Admission: EM | Admit: 2012-10-01 | Discharge: 2012-10-01 | Disposition: A | Discharge status: Home / Self Care | Payer: Self-pay | Attending: Emergency Medicine | Admitting: Emergency Medicine

## 2012-10-01 DIAGNOSIS — I10 Essential (primary) hypertension: Secondary | ICD-10-CM | POA: Insufficient documentation

## 2012-10-01 DIAGNOSIS — Z8659 Personal history of other mental and behavioral disorders: Secondary | ICD-10-CM | POA: Insufficient documentation

## 2012-10-01 DIAGNOSIS — R21 Rash and other nonspecific skin eruption: Secondary | ICD-10-CM | POA: Insufficient documentation

## 2012-10-01 DIAGNOSIS — Z79899 Other long term (current) drug therapy: Secondary | ICD-10-CM | POA: Insufficient documentation

## 2012-10-01 DIAGNOSIS — F17200 Nicotine dependence, unspecified, uncomplicated: Secondary | ICD-10-CM | POA: Insufficient documentation

## 2012-10-01 DIAGNOSIS — L03211 Cellulitis of face: Secondary | ICD-10-CM | POA: Insufficient documentation

## 2012-10-01 DIAGNOSIS — L0201 Cutaneous abscess of face: Secondary | ICD-10-CM | POA: Insufficient documentation

## 2012-10-01 DIAGNOSIS — R220 Localized swelling, mass and lump, head: Secondary | ICD-10-CM | POA: Insufficient documentation

## 2012-10-01 DIAGNOSIS — Z8614 Personal history of Methicillin resistant Staphylococcus aureus infection: Secondary | ICD-10-CM | POA: Insufficient documentation

## 2012-10-01 DIAGNOSIS — F319 Bipolar disorder, unspecified: Secondary | ICD-10-CM | POA: Insufficient documentation

## 2012-10-01 MED ORDER — CEPHALEXIN 500 MG PO CAPS: 500.0000 mg | ORAL_CAPSULE | Freq: Four times a day (QID) | ORAL | Status: DC

## 2012-10-01 MED ORDER — OXYCODONE-ACETAMINOPHEN 5-325 MG PO TABS
1.0000 | ORAL_TABLET | Freq: Once | ORAL | Status: AC
  Administered 2012-10-01: 1 via ORAL
  Filled 2012-10-01: qty 1

## 2012-10-01 MED ORDER — LORAZEPAM 1 MG PO TABS
1.0000 mg | ORAL_TABLET | Freq: Once | ORAL | Status: AC
  Administered 2012-10-01: 1 mg via ORAL
  Filled 2012-10-01: qty 1

## 2012-10-01 MED ORDER — SULFAMETHOXAZOLE-TRIMETHOPRIM 800-160 MG PO TABS: 1.0000 | ORAL_TABLET | Freq: Two times a day (BID) | ORAL | Status: DC

## 2012-10-01 MED ORDER — OXYCODONE-ACETAMINOPHEN 5-325 MG PO TABS: 1.0000 | ORAL_TABLET | Freq: Four times a day (QID) | ORAL | Status: DC | PRN

## 2012-10-01 NOTE — ED Provider Notes (Signed)
  Medical screening examination/treatment/procedure(s) were performed by non-physician practitioner and as supervising physician I was immediately available for consultation/collaboration.    Emelee Rodocker D Mekhia Brogan, MD 10/01/12 1538 

## 2012-10-01 NOTE — ED Notes (Signed)
Pt c/o of having MRSA on left knee from an insect bite, suspects infection has "traveled to his lip". States he noticed swelling to lip and abscess on Sunday. Pain 7/10

## 2012-10-01 NOTE — ED Provider Notes (Signed)
History     CSN: [id number]  Arrival date & time 10/01/12  1009   First MD Initiated Contact with Patient 10/01/12 1021      Chief Complaint  Patient presents with  . Abscess  . Oral Swelling    (Consider location/radiation/quality/duration/timing/severity/associated sxs/prior treatment) HPI  32 year old male presents for evaluations of a possible abscess. Patient notice he has a small scab on his left lower chin since Sunday. For the past few days he has had increasing sharp pain, with associate swelling and redness from the scalp extending to his lower lip. Swelling has become moderate, worsening whenever he eat or chew as he can feel his teeth grinding against the swollen lip. He has to use a straw to eat and drink but has no trouble swallowing. Denies any dental pain. He denies any recent insect bite, or recent trauma. He denies any meds or environmental changes. He reports feeling hot but denies fever chills. He was seen in the ER several days ago for an insect bite to his right knee. States he was diagnosed with MRSA and was given Keflex which he has been taking and has had moderate improvement to his right knee condition. He continues to take Keflex but has not notice any improvement of his lip/chin swelling.    Past Medical History  Diagnosis Date  . PTSD (post-traumatic stress disorder)   . Bipolar 1 disorder   . Alcohol abuse   . Hypertension     Past Surgical History  Procedure Laterality Date  . Appendectomy    . Heel spur surgery    . Foot surgery      No family history on file.  History  Substance Use Topics  . Smoking status: Current Every Day Smoker -- 1.00 packs/day    Types: Cigarettes  . Smokeless tobacco: Never Used  . Alcohol Use: No      Review of Systems  Constitutional: Negative for fever and chills.  HENT: Negative for neck pain.   Skin: Positive for rash and wound.  Neurological: Negative for headaches.    Allergies  Review of patient's  allergies indicates no known allergies.  Home Medications   Current Outpatient Rx  Name  Route  Sig  Dispense  Refill  . cephALEXin (KEFLEX) 500 MG capsule   Oral   Take 500 mg by mouth 4 (four) times daily. For 10 days. Started on march 1. Pt's on day 4 of therapy         . predniSONE (DELTASONE) 20 MG tablet   Oral   Take 2 tablets (40 mg total) by mouth daily.   10 tablet   0   . QUEtiapine (SEROQUEL) 25 MG tablet   Oral   Take 25 mg by mouth 2 (two) times daily.         . QUEtiapine (SEROQUEL) 400 MG tablet   Oral   Take 400 mg by mouth at bedtime.            BP 121/110  Pulse 115  Temp(Src) 97.8 F (36.6 C) (Oral)  Resp 20  SpO2 100%  Physical Exam  Nursing note and vitals reviewed. Constitutional: He appears well-developed and well-nourished. No distress.  HENT:  Head: Normocephalic and atraumatic.  Chin: a scab noted to left superior aspect of chin inferior to lower lip.  Left lower lip is moderately edematous, tender to the touch, firm, and warm.    Tongue is dry.  No dental pain, no trismus, no obvious evidence  of deep tissue infection.    Musculoskeletal: He exhibits no edema.  R knee: a well healing scab were noted to superio-medial aspect of anterior knee, nontender on palpation, no erythema, induration , or fluctuance.  Skin:  Refer to HENT    ED Course  Procedures (including critical care time)  Labs Reviewed - No data to display No results found.   No diagnosis found.  INCISION AND DRAINAGE Performed by: Fayrene Helper Consent: Verbal consent obtained. Risks and benefits: risks, benefits and alternatives were discussed Type: abscess  Body area: Left upper chin  Anesthesia: local infiltration  Incision was made with a scalpel.  Local anesthetic: lidocaine 2% w epinephrine  Anesthetic total: 1 ml  Complexity: complex Blunt dissection to break up loculations  Drainage: purulent  Drainage amount: minimal  Packing material:  none  Patient tolerance: Patient tolerated the procedure well with no immediate complications.     10:49 AM Patient was seen and evaluated by me for a possible abscess in his left lower lip and chin. Patient agrees for an I&D procedure and acknowledges the risk of scar formation.  12:03 PM I&D performed with minimal pustular exudates.  Pt felt better.  Will d/c with keflex and bactrim for abscess cellulitis.  Care instruction including warm compress, clean with soap/water and return precaution discussed.  Pt voice understanding and agrees with plan.  Smoking cessation discussed.    BP 121/110  Pulse 115  Temp(Src) 97.8 F (36.6 C) (Oral)  Resp 20  SpO2 100%  1. Cutaneous abscess of chin MDM          Fayrene Helper, PA-C 10/01/12 1205

## 2012-10-02 ENCOUNTER — Encounter (HOSPITAL_COMMUNITY): Payer: Self-pay | Admitting: Emergency Medicine

## 2012-10-02 ENCOUNTER — Inpatient Hospital Stay (HOSPITAL_COMMUNITY)
Admission: EM | Admit: 2012-10-02 | Discharge: 2012-10-04 | DRG: 603 | Disposition: A | Discharge status: Home / Self Care | Payer: MEDICAID | Attending: Internal Medicine | Admitting: Internal Medicine

## 2012-10-02 DIAGNOSIS — L0201 Cutaneous abscess of face: Principal | ICD-10-CM | POA: Diagnosis present

## 2012-10-02 DIAGNOSIS — Z79899 Other long term (current) drug therapy: Secondary | ICD-10-CM

## 2012-10-02 DIAGNOSIS — I10 Essential (primary) hypertension: Secondary | ICD-10-CM | POA: Diagnosis present

## 2012-10-02 DIAGNOSIS — F319 Bipolar disorder, unspecified: Secondary | ICD-10-CM | POA: Diagnosis present

## 2012-10-02 DIAGNOSIS — F17200 Nicotine dependence, unspecified, uncomplicated: Secondary | ICD-10-CM | POA: Diagnosis present

## 2012-10-02 DIAGNOSIS — D72829 Elevated white blood cell count, unspecified: Secondary | ICD-10-CM | POA: Diagnosis present

## 2012-10-02 DIAGNOSIS — L03211 Cellulitis of face: Principal | ICD-10-CM | POA: Diagnosis present

## 2012-10-02 DIAGNOSIS — F1010 Alcohol abuse, uncomplicated: Secondary | ICD-10-CM | POA: Diagnosis present

## 2012-10-02 DIAGNOSIS — F4310 Post-traumatic stress disorder, unspecified: Secondary | ICD-10-CM | POA: Diagnosis present

## 2012-10-02 LAB — RAPID URINE DRUG SCREEN, HOSP PERFORMED
Amphetamines: NOT DETECTED
Barbiturates: NOT DETECTED
Benzodiazepines: NOT DETECTED
Tetrahydrocannabinol: POSITIVE — AB

## 2012-10-02 LAB — CBC WITH DIFFERENTIAL/PLATELET
Eosinophils Relative: 1 % (ref 0–5)
HCT: 45.4 % (ref 39.0–52.0)
Hemoglobin: 16 g/dL (ref 13.0–17.0)
Lymphocytes Relative: 37 % (ref 12–46)
Lymphs Abs: 5.1 10*3/uL — ABNORMAL HIGH (ref 0.7–4.0)
MCV: 85.2 fL (ref 78.0–100.0)
Monocytes Absolute: 1.3 10*3/uL — ABNORMAL HIGH (ref 0.1–1.0)
Platelets: 202 10*3/uL (ref 150–400)
RBC: 5.33 MIL/uL (ref 4.22–5.81)
WBC: 13.8 10*3/uL — ABNORMAL HIGH (ref 4.0–10.5)

## 2012-10-02 LAB — BASIC METABOLIC PANEL
CO2: 29 mEq/L (ref 19–32)
Calcium: 9 mg/dL (ref 8.4–10.5)
Glucose, Bld: 85 mg/dL (ref 70–99)
Sodium: 140 mEq/L (ref 135–145)

## 2012-10-02 MED ORDER — CLINDAMYCIN PHOSPHATE 600 MG/50ML IV SOLN
600.0000 mg | Freq: Once | INTRAVENOUS | Status: AC
  Administered 2012-10-02: 600 mg via INTRAVENOUS
  Filled 2012-10-02: qty 50

## 2012-10-02 MED ORDER — QUETIAPINE FUMARATE 200 MG PO TABS
400.0000 mg | ORAL_TABLET | Freq: Every day | ORAL | Status: DC
  Administered 2012-10-02 – 2012-10-03 (×2): 400 mg via ORAL
  Filled 2012-10-02 (×3): qty 2

## 2012-10-02 MED ORDER — ONDANSETRON HCL 4 MG/2ML IJ SOLN: 4.0000 mg | Freq: Four times a day (QID) | INTRAMUSCULAR | Status: DC | PRN

## 2012-10-02 MED ORDER — HYDROMORPHONE HCL PF 1 MG/ML IJ SOLN
1.0000 mg | Freq: Once | INTRAMUSCULAR | Status: AC
  Administered 2012-10-02: 1 mg via INTRAVENOUS
  Filled 2012-10-02: qty 1

## 2012-10-02 MED ORDER — QUETIAPINE FUMARATE 400 MG PO TABS
400.0000 mg | ORAL_TABLET | Freq: Every day | ORAL | Status: DC
  Filled 2012-10-02: qty 1

## 2012-10-02 MED ORDER — METHYLPREDNISOLONE SODIUM SUCC 125 MG IJ SOLR
60.0000 mg | Freq: Two times a day (BID) | INTRAMUSCULAR | Status: DC
  Administered 2012-10-03 (×2): 60 mg via INTRAVENOUS
  Filled 2012-10-02 (×4): qty 0.96

## 2012-10-02 MED ORDER — HYDROCODONE-ACETAMINOPHEN 5-325 MG PO TABS
1.0000 | ORAL_TABLET | ORAL | Status: DC | PRN
  Administered 2012-10-02 – 2012-10-03 (×7): 2 via ORAL
  Filled 2012-10-02 (×7): qty 2

## 2012-10-02 MED ORDER — VANCOMYCIN HCL IN DEXTROSE 1-5 GM/200ML-% IV SOLN
1000.0000 mg | Freq: Three times a day (TID) | INTRAVENOUS | Status: DC
  Administered 2012-10-03 – 2012-10-04 (×5): 1000 mg via INTRAVENOUS
  Filled 2012-10-02 (×6): qty 200

## 2012-10-02 MED ORDER — ONDANSETRON HCL 4 MG/2ML IJ SOLN: 4.0000 mg | Freq: Three times a day (TID) | INTRAMUSCULAR | Status: DC | PRN

## 2012-10-02 MED ORDER — DIPHENHYDRAMINE HCL 50 MG/ML IJ SOLN: 25.0000 mg | Freq: Two times a day (BID) | INTRAMUSCULAR | Status: DC

## 2012-10-02 MED ORDER — ENOXAPARIN SODIUM 60 MG/0.6ML ~~LOC~~ SOLN
50.0000 mg | SUBCUTANEOUS | Status: DC
  Administered 2012-10-02: 50 mg via SUBCUTANEOUS
  Filled 2012-10-02 (×3): qty 0.6

## 2012-10-02 MED ORDER — QUETIAPINE FUMARATE 25 MG PO TABS
25.0000 mg | ORAL_TABLET | Freq: Two times a day (BID) | ORAL | Status: DC
  Administered 2012-10-02 – 2012-10-04 (×3): 25 mg via ORAL
  Filled 2012-10-02 (×5): qty 1

## 2012-10-02 MED ORDER — VANCOMYCIN HCL 10 G IV SOLR
1500.0000 mg | Freq: Once | INTRAVENOUS | Status: AC
  Administered 2012-10-02: 1500 mg via INTRAVENOUS
  Filled 2012-10-02: qty 1500

## 2012-10-02 MED ORDER — HYDROMORPHONE HCL PF 1 MG/ML IJ SOLN
1.0000 mg | INTRAMUSCULAR | Status: DC | PRN
  Administered 2012-10-02 – 2012-10-03 (×6): 1 mg via INTRAVENOUS
  Filled 2012-10-02 (×6): qty 1

## 2012-10-02 MED ORDER — METHYLPREDNISOLONE SODIUM SUCC 125 MG IJ SOLR
125.0000 mg | Freq: Once | INTRAMUSCULAR | Status: AC
  Administered 2012-10-02: 125 mg via INTRAVENOUS
  Filled 2012-10-02: qty 2

## 2012-10-02 MED ORDER — ONDANSETRON HCL 4 MG PO TABS: 4.0000 mg | ORAL_TABLET | Freq: Four times a day (QID) | ORAL | Status: DC | PRN

## 2012-10-02 MED ORDER — MORPHINE SULFATE 4 MG/ML IJ SOLN
4.0000 mg | Freq: Once | INTRAMUSCULAR | Status: AC
  Administered 2012-10-02: 4 mg via INTRAVENOUS
  Filled 2012-10-02: qty 1

## 2012-10-02 MED ORDER — SODIUM CHLORIDE 0.9 % IV SOLN
INTRAVENOUS | Status: AC
  Administered 2012-10-02 – 2012-10-03 (×2): via INTRAVENOUS

## 2012-10-02 MED ORDER — DIPHENHYDRAMINE HCL 50 MG/ML IJ SOLN
25.0000 mg | Freq: Four times a day (QID) | INTRAMUSCULAR | Status: DC | PRN
  Filled 2012-10-02: qty 1

## 2012-10-02 MED ORDER — SODIUM CHLORIDE 0.9 % IV BOLUS (SEPSIS)
1000.0000 mL | Freq: Once | INTRAVENOUS | Status: AC
  Administered 2012-10-02: 1000 mL via INTRAVENOUS

## 2012-10-02 MED ORDER — SODIUM CHLORIDE 0.9 % IV SOLN: INTRAVENOUS | Status: DC

## 2012-10-02 MED ORDER — PIPERACILLIN-TAZOBACTAM 3.375 G IVPB
3.3750 g | Freq: Three times a day (TID) | INTRAVENOUS | Status: DC
  Administered 2012-10-02 – 2012-10-04 (×6): 3.375 g via INTRAVENOUS
  Filled 2012-10-02 (×7): qty 50

## 2012-10-02 MED ORDER — DIPHENHYDRAMINE HCL 50 MG/ML IJ SOLN
25.0000 mg | Freq: Two times a day (BID) | INTRAMUSCULAR | Status: DC
  Administered 2012-10-02 – 2012-10-03 (×4): 25 mg via INTRAVENOUS
  Filled 2012-10-02: qty 1
  Filled 2012-10-02 (×3): qty 0.5
  Filled 2012-10-02: qty 1
  Filled 2012-10-02: qty 0.5

## 2012-10-02 NOTE — Progress Notes (Signed)
Report rec'd from ED RN Lavonda Jumbo.  Pt coming to rm 1343.

## 2012-10-02 NOTE — Progress Notes (Signed)
Pt arrived in wheelchair.  A&O.  Oriented to room, call bell.

## 2012-10-02 NOTE — ED Notes (Signed)
Patient refused finger stick for CBG

## 2012-10-02 NOTE — Progress Notes (Signed)
CM spoke with pt who confirms self pay [facility] resident with no pcp. CM discussed with pt, male and male family members at bedside and provided written information for self pay pcps, importance of pcp for f/u care, www.needymeds.org, discounted pharmacies, and other [place] resources such as financial assistance, DSS and  health department Reviewed Health connect number to assist with finding self pay provider close to pt's residence. Reviewed resources for [facility], general medical clinics, [facility] out patient [facility]ies, housing, affordable care act/health reform (deadline 10/27/12) and other resources in [employer]. Pt voiced understanding and appreciation of resources provided

## 2012-10-02 NOTE — Progress Notes (Signed)
ANTIBIOTIC CONSULT NOTE - INITIAL  Pharmacy Consult for Vanc/Zosyn Indication: Facial abscess  No Known Allergies  Patient Measurements: Height: 5\' 10"  (177.8 cm) Weight: 210 lb (95.255 kg) IBW/kg (Calculated) : 73 Adjusted Body Weight: 82kg  Vital Signs: Temp: 98.4 F (36.9 C) (03/06 1420) Temp src: Oral (03/06 1420) BP: 150/90 mmHg (03/06 1420) Pulse Rate: 86 (03/06 1420) Intake/Output from previous day:   Intake/Output from this shift:    Labs:  Recent Labs  10/02/12 1115  WBC 13.8*  HGB 16.0  PLT 202  CREATININE 0.89   Estimated Creatinine Clearance: 141.9 ml/min (by C-G formula based on Cr of 0.89). No results found for this basename: VANCOTROUGH, VANCOPEAK, VANCORANDOM, GENTTROUGH, GENTPEAK, GENTRANDOM, TOBRATROUGH, TOBRAPEAK, TOBRARND, AMIKACINPEAK, AMIKACINTROU, AMIKACIN,  in the last 72 hours   Microbiology: No results found for this or any previous visit (from the past 720 hour(s)).  Medical History: Past Medical History  Diagnosis Date  . PTSD (post-traumatic stress disorder)   . Bipolar 1 disorder   . Alcohol abuse   . Hypertension     Medications:  Anti-infectives   Start     Dose/Rate Route Frequency Ordered Stop   10/02/12 1100  clindamycin (CLEOCIN) IVPB 600 mg     600 mg 100 mL/hr over 30 Minutes Intravenous  Once 10/02/12 1047 10/02/12 1229     Assessment: 31 YOM w facial abscess. Worsened sx and spreading despite I and D and Keflex and Septra (started 3/5)  95kg, renal fx wnl - CrCl > 16ml/min,   Afebrile, leukocytoisis  Goal of Therapy:  Vancomycin trough level 15-20 mcg/ml  Plan:   Zosyn 3.375g IV q8h, each dose over 4 hours  Vancomycin 1500mg  IV load then 1g IV q8h  VT @ Css  Follow labs, vitals and cx  Adjust doses as necessary  Gwen Her PharmD  [phone] 10/02/2012 2:47 PM

## 2012-10-02 NOTE — H&P (Signed)
[facility] History and Physical  Ernest Escobar AVW:[id number] DOB: 1980/09/29 DOA: 10/02/2012  Referring physician: ED physician PCP: No primary provider on file.   Chief Complaint:   HPI:  This 32 year old male who presents to [facility] emergency department with main concern of progressively worsening swelling and abscess- like scab in the left lower chin area that started 4 days prior to admission. Patient reports that this initially started as a small scab but now has progressed to abscess and its associated with erythema, tenderness to palpation, warm to touch, draining of yellow pus, subjective fevers and chills. Patient reports the abscess is getting bigger, it is extending down towards the lower lip and he feels as if lips are now more swollen. Pt apparently had I&D few days ago but this is getting worse. He is having difficulty eating as a result, uses straws to drink, but denies difficulty swallowing or any dental pain. Patient denies shortness of breath, chest pain, no other systemic symptoms. Patient denies any specific allergies. He explains he was in emergency department several days ago after he had an insect bite in the right knee and he explains he was diagnosed with MRSA and was given Keflex. He explains that he is not seeing significant improvement with abscess on the chin area that has seen improvement in the right knee area.   Assessment and Plan:  Principal Problem:   Abscess in the chin area - Will admit patient to medical floor for further evaluation and management - Given the fact that the abscess is getting worse and extending towards the lip area will start patient on broad-spectrum antibiotics vancomycin and Zosyn - Will plan on tapering down antibiotic regimen as clinically indicated - Will provide supportive care with IV fluids, analgesia as needed for pain control - Since patient had I&D recently will hold off on this right now but may need a repeat I&D  if this does not improve clinically on current regimen Active Problems:   Lip swelling - It is unclear if this is related to the above-mentioned abscess or this is an allergic reaction to an antibiotic - I will give patient one dose of Solu-Medrol 125 mg IV, continue with Solu-Medrol 60 mg twice a day starting tomorrow and taper down with prednisone - Start Benadryl as needed   Leukocytosis - This is likely secondary to abscess, continue antibiotics as mentioned above - CBC in the morning  Code Status: Full Family Communication: Pt at bedside Disposition Plan: Admit to medical floor  Review of Systems:  Constitutional: Positive for fever, chills and malaise/fatigue. Negative for diaphoresis.  HENT: Negative for hearing loss, ear pain, nosebleeds, congestion, sore throat, neck pain, tinnitus and ear discharge.   Eyes: Negative for blurred vision, double vision, photophobia, pain, discharge and redness.  Respiratory: Negative for cough, hemoptysis, sputum production, shortness of breath, wheezing and stridor.   Cardiovascular: Negative for chest pain, palpitations, orthopnea, claudication and leg swelling.  Gastrointestinal: Negative for nausea, vomiting and abdominal pain. Negative for heartburn, constipation, blood in stool and melena.  Genitourinary: Negative for dysuria, urgency, frequency, hematuria and flank pain.  Musculoskeletal: Negative for myalgias, back pain, joint pain and falls.  Skin: Negative for itching and rash.  Neurological: Negative for dizziness and weakness. Negative for tingling, tremors, sensory change, speech change, focal weakness, loss of consciousness and headaches.  Endo/Heme/Allergies: Negative for environmental allergies and polydipsia. Does not bruise/bleed easily.  Psychiatric/Behavioral: Negative for suicidal ideas. The patient is not nervous/anxious.  Past Medical History  Diagnosis Date  . PTSD (post-traumatic stress disorder)   . Bipolar 1  disorder   . Alcohol abuse   . Hypertension     Past Surgical History  Procedure Laterality Date  . Appendectomy    . Heel spur surgery    . Foot surgery      Social History:  reports that he has been smoking Cigarettes.  He has been smoking about 1.00 pack per day. He has never used smokeless tobacco. He reports that he does not drink alcohol or use illicit drugs.  No Known Allergies  Family History  Problem Relation Age of Onset  . Heart disease Father   . High blood pressure Father     Prior to Admission medications   Medication Sig Start Date End Date Taking? Authorizing Provider  cephALEXin (KEFLEX) 500 MG capsule Take 500 mg by mouth 4 (four) times daily. 10/01/12  Yes Fayrene Helper, PA-C  ibuprofen (ADVIL,MOTRIN) 200 MG tablet Take 400 mg by mouth every 6 (six) hours as needed for pain.   Yes Historical Provider, MD  oxyCODONE-acetaminophen (PERCOCET/ROXICET) 5-325 MG per tablet Take 1-2 tablets by mouth every 6 (six) hours as needed for pain. 10/01/12  Yes Fayrene Helper, PA-C  QUEtiapine (SEROQUEL) 25 MG tablet Take 25 mg by mouth 2 (two) times daily.   Yes Historical Provider, MD  QUEtiapine (SEROQUEL) 400 MG tablet Take 400 mg by mouth at bedtime.    Yes Historical Provider, MD  sulfamethoxazole-trimethoprim (BACTRIM DS,SEPTRA DS) 800-160 MG per tablet Take 1 tablet by mouth every 12 (twelve) hours. 10/01/12  Yes Fayrene Helper, PA-C  predniSONE (DELTASONE) 20 MG tablet Take 2 tablets (40 mg total) by mouth daily. 09/25/12   Annett Gula, MD    Physical Exam: Filed Vitals:   10/02/12 1018 10/02/12 1229  BP: 153/91 146/95  Pulse: 115 86  Temp: 97.8 F (36.6 C) 98.7 F (37.1 C)  TempSrc: Oral Oral  SpO2: 98% 98%    Physical Exam  Constitutional: Appears well-developed and well-nourished. No distress.  HENT: Normocephalic. External right and left ear normal. Oropharynx is clear and moist.  Eyes: Conjunctivae and EOM are normal. PERRLA, no scleral icterus.  Neck: Normal ROM.  Neck supple. No JVD. No tracheal deviation. No thyromegaly.  CVS: RRR, S1/S2 +, no murmurs, no gallops, no carotid bruit.  Pulmonary: Effort and breath sounds normal, no stridor, rhonchi, wheezes, rales.  Abdominal: Soft. BS +,  no distension, tenderness, rebound or guarding.  Musculoskeletal: Normal range of motion. No edema and no tenderness.  Lymphadenopathy: No lymphadenopathy noted, cervical, inguinal. Neuro: Alert. Normal reflexes, muscle tone coordination. No cranial nerve deficit. Skin: Small 1 cm diameter abscess, with surrounding erythema, pus draining yellow in color, tenderness to palpation, located between the chin area and the lower lip area.  Psychiatric: Normal mood and affect. Behavior, judgment, thought content normal.   Labs on Admission:  Basic Metabolic Panel:  Recent Labs Lab 10/02/12 1115  NA 140  K 4.2  CL 101  CO2 29  GLUCOSE 85  BUN 15  CREATININE 0.89  CALCIUM 9.0   CBC:  Recent Labs Lab 10/02/12 1115  WBC 13.8*  NEUTROABS 7.2  HGB 16.0  HCT 45.4  MCV 85.2  PLT 202   CBG:  Recent Labs Lab 10/02/12 1119  GLUCAP 79   EKG: Normal sinus rhythm, no ST/T wave changes  Debbora Presto, MD  [facility] Pager [phone]  If 7PM-7AM, please contact night-coverage [email or web address] Password  [email or web address] 10/02/2012, 1:47 PM

## 2012-10-02 NOTE — ED Provider Notes (Signed)
History     CSN: [id number]  Arrival date & time 10/02/12  1002   First MD Initiated Contact with Patient 10/02/12 1007      Chief Complaint  Patient presents with  . Facial Swelling    lip    (Consider location/radiation/quality/duration/timing/severity/associated sxs/prior treatment) The history is provided by the patient, a parent, a friend and medical records. No language interpreter was used.    Ernest Escobar is a 32 y.o. male  with a hx of PTSD, bipolar, EtOH abuse, HTN presents to the Emergency Department complaining of gradual, persistent, progressively worsening swelling and erythema to the bottom lip onset 3 days ago.  Pt was seen here yesterday with small I&D performed and addition of Bactrim to his Keflex Rx.  Pt states he has take more than 10 percocet since he left the ER yesterday and states that the pain medication is not helping his pain.  He also endorses continued swelling and now difficulty swallowing.  Associated symptoms include pain, erythema, dysphagia.  Nothing makes it better and eating makes it worse.  Pt denies fever, chills, headache, neck pain, chest pain, shortness of breath, abdominal pain, nausea, vomiting, diarrhea, weakness, dizziness, syncope.     Past Medical History  Diagnosis Date  . PTSD (post-traumatic stress disorder)   . Bipolar 1 disorder   . Alcohol abuse   . Hypertension     Past Surgical History  Procedure Laterality Date  . Appendectomy    . Heel spur surgery    . Foot surgery      History reviewed. No pertinent family history.  History  Substance Use Topics  . Smoking status: Current Every Day Smoker -- 1.00 packs/day    Types: Cigarettes  . Smokeless tobacco: Never Used  . Alcohol Use: No      Review of Systems  Constitutional: Negative for fever.  HENT: Positive for facial swelling. Negative for hearing loss, ear pain, nosebleeds, congestion, rhinorrhea, neck pain, neck stiffness, postnasal drip, sinus pressure  and tinnitus.   Eyes: Negative for visual disturbance.  Respiratory: Negative for cough.   Cardiovascular: Negative for leg swelling.  Gastrointestinal: Negative for nausea and vomiting.  Endocrine: Negative for polydipsia, polyphagia and polyuria.  Genitourinary: Negative for dysuria.  Skin: Positive for rash and wound.  Neurological: Negative for headaches.  Hematological: Positive for adenopathy. Does not bruise/bleed easily.    Allergies  Review of patient's allergies indicates no known allergies.  Home Medications   Current Outpatient Rx  Name  Route  Sig  Dispense  Refill  . cephALEXin (KEFLEX) 500 MG capsule   Oral   Take 500 mg by mouth 4 (four) times daily.         Marland Kitchen ibuprofen (ADVIL,MOTRIN) 200 MG tablet   Oral   Take 400 mg by mouth every 6 (six) hours as needed for pain.         Marland Kitchen oxyCODONE-acetaminophen (PERCOCET/ROXICET) 5-325 MG per tablet   Oral   Take 1-2 tablets by mouth every 6 (six) hours as needed for pain.   20 tablet   0   . QUEtiapine (SEROQUEL) 25 MG tablet   Oral   Take 25 mg by mouth 2 (two) times daily.         . QUEtiapine (SEROQUEL) 400 MG tablet   Oral   Take 400 mg by mouth at bedtime.          . sulfamethoxazole-trimethoprim (BACTRIM DS,SEPTRA DS) 800-160 MG per tablet   Oral  Take 1 tablet by mouth every 12 (twelve) hours.         . predniSONE (DELTASONE) 20 MG tablet   Oral   Take 2 tablets (40 mg total) by mouth daily.   10 tablet   0     BP 153/91  Pulse 115  Temp(Src) 97.8 F (36.6 C) (Oral)  SpO2 98%  Physical Exam  Nursing note and vitals reviewed. Constitutional: He is oriented to person, place, and time. He appears well-developed and well-nourished. No distress.  HENT:  Head: Normocephalic and atraumatic. No trismus in the jaw.  Right Ear: Tympanic membrane, external ear and ear canal normal.  Left Ear: Tympanic membrane, external ear and ear canal normal.  Nose: Nose normal. No mucosal edema or  rhinorrhea.  Mouth/Throat: Mucous membranes are normal. Mucous membranes are not dry and not cyanotic. He does not have dentures. No oral lesions. Normal dentition. No dental abscesses, edematous, lacerations or dental caries. No oropharyngeal exudate, posterior oropharyngeal edema, posterior oropharyngeal erythema or tonsillar abscesses.    Open draining wound on the L lateral superior aspect of the chin below the lower lip; surrounding edema, induration, erythema with signs of mild cellulitis extend up into the lip and down into the chin.   Pain to palpation of the L sublingual space  Eyes: Conjunctivae and EOM are normal. Pupils are equal, Escobar, and reactive to light. No scleral icterus.  Neck: Normal range of motion and full passive range of motion without pain. No spinous process tenderness and no muscular tenderness present. No rigidity. No edema, no erythema and normal range of motion present.  Cardiovascular: Normal rate, regular rhythm, normal heart sounds and intact distal pulses.  Exam reveals no gallop and no friction rub.   No murmur heard. Pulmonary/Chest: Effort normal and breath sounds normal. No respiratory distress. He has no wheezes. He has no rales. He exhibits no tenderness.  Abdominal: Soft. He exhibits no distension. There is no tenderness. There is no rebound.  Musculoskeletal: Normal range of motion. He exhibits no edema and no tenderness.  Lymphadenopathy:    He has no cervical adenopathy.  Neurological: He is alert and oriented to person, place, and time. He exhibits normal muscle tone. Coordination normal.  Skin: Skin is warm and dry. He is not diaphoretic. There is erythema.  See HENT  Psychiatric: He has a normal mood and affect.    ED Course  Procedures (including critical care time)  Labs Reviewed  WOUND CULTURE  CBC WITH DIFFERENTIAL  BASIC METABOLIC PANEL   No results found.   1. Abscess   2. Cellulitis       MDM  Ernest Escobar presents  with draining abscess and cellulitis to the lower lip and chin.  Pt was given steroids 1 week ago for a possible allergic reaction, but is not otherwise immunocompromised.  Pt denies risk factors for HIV and CBG in the department is 79.    Record review shows pt was seen on 09/25/12 for spider bite on R knee and dx with allergic rxn; again on 09/29/12 for the same and dx with infection and given Keflex.  Returned on 10/01/12 with new abscess on his chin, had I&D and given bactrim.    Pt given Clinda IV, pain control and wound culture sent.  Wound is draining and I do not believe another I&D is necessary at this time.  Will obtain CBC, BMP, and proceed with admission.    On re-evaluation, pt with spreading infection  and increased pain.  Pt admitted to hospitalist.  I discussed pt with admitting physician and we are in agreement that surgery should be involved at this time.  Admitting physician will contact them.          Dahlia Client Muthersbaugh, PA-C 10/02/12 2029

## 2012-10-02 NOTE — ED Notes (Signed)
Pt was just here on yesterday because of lip infection, started swelling more last night and hurts worse. States he cannot open his mouth.

## 2012-10-02 NOTE — Progress Notes (Signed)
[facility] hospital[facility] progress note. Chief complaint. Facial abscess. History of present illness. This 32 year old male was admitted with progressively worsening swelling and abscess to the left lower chin.  he had I&D performed a few days ago but the symptoms kept getting worse. Patient was initiated on broad-spectrum antibiotics with vancomycin and soft and. He tells me that plastic surgery is a consult at and will see him in the morning. Nursing contacted me saying the patient was concerned about a new area of purulent drainage and I came to see him at the bedside. He says that a whole has opened in his lower lip which is draining purulent material. I came to see the patient at the bedside. Physical exam. Temperature 97.9, pulse 87, respiration 16, blood pressure 147/74. O2 sats 98% on room air. General appearance. Well-developed young male who is alert, cooperative, and in no distress. HEENT. There is a scabbed area just below the left side of the lower lip. There is edema and erythema extending from the scabbed area and including most of the lower lip. Patient states the scabbed area as where I&D was previously performed. I visualized the internal lower lip and there does appear to be a small sinus draining a small amount of whitish material. Impression/plan. Problem #1. Facial cellulitis. After reviewing the site I don't believe this represents any significant worsening of the patient's cellulitis. I encouraged the patient indicating he was on very broad spectrum antibiotics which should be soon improving his presentation. He states he has already been arranged for a plastic surgery consult in the a.m. to consider repeat I&D. A lipid self has a good capillary refill and I see no indication of tissue necrosis either externally or on the internal lip. We'll continue current management and nursing will update me of any further changes overnight.

## 2012-10-03 ENCOUNTER — Inpatient Hospital Stay (HOSPITAL_COMMUNITY): Payer: Self-pay

## 2012-10-03 LAB — CBC
MCV: 84.7 fL (ref 78.0–100.0)
Platelets: 227 10*3/uL (ref 150–400)
RBC: 5.31 MIL/uL (ref 4.22–5.81)
RDW: 12.4 % (ref 11.5–15.5)
WBC: 14.1 10*3/uL — ABNORMAL HIGH (ref 4.0–10.5)

## 2012-10-03 LAB — BASIC METABOLIC PANEL
CO2: 27 mEq/L (ref 19–32)
Chloride: 97 mEq/L (ref 96–112)
GFR calc Af Amer: >90 mL/min (ref >90)
Potassium: 4.2 mEq/L (ref 3.5–5.1)
Sodium: 134 mEq/L — ABNORMAL LOW (ref 135–145)

## 2012-10-03 MED ORDER — NICOTINE 14 MG/24HR TD PT24
14.0000 mg | MEDICATED_PATCH | Freq: Every day | TRANSDERMAL | Status: DC
  Administered 2012-10-03: 14 mg via TRANSDERMAL
  Filled 2012-10-03 (×2): qty 1

## 2012-10-03 MED ORDER — GADOBENATE DIMEGLUMINE 529 MG/ML IV SOLN
20.0000 mL | Freq: Once | INTRAVENOUS | Status: AC | PRN
  Administered 2012-10-03: 20 mL via INTRAVENOUS

## 2012-10-03 MED ORDER — LORAZEPAM 1 MG PO TABS
1.0000 mg | ORAL_TABLET | Freq: Two times a day (BID) | ORAL | Status: DC | PRN
  Administered 2012-10-03: 1 mg via ORAL
  Filled 2012-10-03: qty 1

## 2012-10-03 MED ORDER — HYDROMORPHONE HCL PF 1 MG/ML IJ SOLN
1.0000 mg | INTRAMUSCULAR | Status: DC | PRN
  Administered 2012-10-03 – 2012-10-04 (×5): 1 mg via INTRAVENOUS
  Filled 2012-10-03 (×5): qty 1

## 2012-10-03 NOTE — ED Provider Notes (Signed)
  Medical screening examination/treatment/procedure(s) were performed by non-physician practitioner and as supervising physician I was immediately available for consultation/collaboration.    Gerhard Munch, MD 10/03/12 [phone]

## 2012-10-03 NOTE — Progress Notes (Addendum)
[facility] PROGRESS NOTE  Ernest Escobar ZOX:[id number] DOB: 1980-08-12 DOA: 10/02/2012 PCP: No primary provider on file.  Brief narrative: 32 year old male with past medical history of depression admitted 10/02/2012 for small chin abscess for 4 days prior to this admission. Patient did come to ED 1 day prior to this admission and this chin abscess was drained at that time and he was sent home on Keflex. There was no significant improvement and patient has noticed his lower lip started to swell. In ED, CBC revealed mild leukocytosis of 13.8. Patient ws afebrile. His UDS was positive for THC and opiates.  Assessment and Plan:   Principal Problem:  *Abscess in the chin area   Started on vanco and zosyn. Initial wound culture with staph species. We will follow up final results.  MRI shows 7 mm lower lip abscess. Spoke with surgery and ENT on call and they both agreed this is too small of an abscess to be drained and to continue antibiotics and warm compresses.  May need surgery consult (plastic surgery) depending on MRI results or if no significant improvement.  Continue steroids and benadryl  Will admit patient to medical floor for further evaluation and management   Active Problems:  Leukocytosis   Secondary to chin abscess  Continue  vanco and zosyn   Code Status: Full  Family Communication: Pt at bedside  Disposition Plan: home when stable   Manson Passey, MD  [facility] Pager [phone]  If 7PM-7AM, please contact night-coverage [email or web address] Password [facility] 10/03/2012, 7:48 AM   LOS: 1 day   Consultants:  None   Studies:  MRI TMJ/ chin 10/03/2012 - 7 mm abscess lower lip area  Antibiotics:  vanco 10/02/2012 -->  Zosyn 10/02/2012 -->  HPI/Subjective: Still with swollen lip and chin abscess.  Objective: Filed Vitals:   10/02/12 1229 10/02/12 1420 10/02/12 2125 10/03/12 0537  BP: 146/95 150/90 147/74 107/57  Pulse: 86 86 87 76  Temp: 98.7 F (37.1 C) 98.4 F  (36.9 C) 97.9 F (36.6 C) 97.5 F (36.4 C)  TempSrc: Oral Oral Oral Oral  Resp:  18 16 16   Height:  5\' 10"  (1.778 m)    Weight:  95.255 kg (210 lb)    SpO2: 98% 99% 98% 96%   No intake or output data in the 24 hours ending 10/03/12 0748  Exam:   General:  Pt is alert, follows commands appropriately, not in acute distress  Mouth: lower lip swelling; mid chin small abscess no blood drainage  Cardiovascular: Regular rate and rhythm, S1/S2, no murmurs, no rubs, no gallops  Respiratory: Clear to auscultation bilaterally, no wheezing, no crackles, no rhonchi  Abdomen: Soft, non tender, non distended, bowel sounds present, no guarding  Extremities: No edema, pulses DP and PT palpable bilaterally  Neuro: Grossly nonfocal  Data Reviewed: Basic Metabolic Panel:  Recent Labs Lab 10/02/12 1115  NA 140  K 4.2  CL 101  CO2 29  GLUCOSE 85  BUN 15  CREATININE 0.89  CALCIUM 9.0   CBC:  Recent Labs Lab 10/02/12 1115  WBC 13.8*  NEUTROABS 7.2  HGB 16.0  HCT 45.4  MCV 85.2  PLT 202   Cardiac Enzymes: No results found for this basename: CKTOTAL, CKMB, CKMBINDEX, TROPONINI,  in the last 168 hours BNP: No components found with this basename: POCBNP,  CBG:  Recent Labs Lab 10/02/12 1119  GLUCAP 79    Recent Results (from the past 240 hour(s))  WOUND CULTURE     Status:  None   Collection Time    10/02/12 10:55 AM      Result Value Range Status   Specimen Description WOUND LT LOWER LIP   Final   Special Requests Normal   Final   Gram Stain PENDING   Incomplete   Culture     Final   Value: FEW STAPHYLOCOCCUS AUREUS     Note: RIFAMPIN AND GENTAMICIN SHOULD NOT BE USED AS SINGLE DRUGS FOR TREATMENT OF STAPH INFECTIONS.   Report Status PENDING   Incomplete     Studies: No results found.  Scheduled Meds: . diphenhydrAMINE  25 mg Intravenous BID  . enoxaparin (LOVENOX) injection  50 mg Subcutaneous Q24H  . methylPREDNISolone (SOLU-MEDROL) injection  60 mg  Intravenous Q12H  . piperacillin-tazobactam (ZOSYN)  IV  3.375 g Intravenous Q8H  . QUEtiapine  25 mg Oral BID  . QUEtiapine  400 mg Oral QHS  . vancomycin  1,000 mg Intravenous Q8H

## 2012-10-03 NOTE — Progress Notes (Signed)
I have not been present at the time this progress note was written but I agree with the assessment and plan outlined above. Patient is on broad spectrum antibiotics already. No other management is needed. Manson Passey [facility] [phone]

## 2012-10-04 LAB — CREATININE, SERUM
Creatinine, Ser: 0.79 mg/dL (ref 0.50–1.35)
GFR calc Af Amer: >90 mL/min (ref >90)
GFR calc non Af Amer: >90 mL/min (ref >90)

## 2012-10-04 LAB — VANCOMYCIN, TROUGH: Vancomycin Tr: 9.6 ug/mL — ABNORMAL LOW (ref 10.0–20.0)

## 2012-10-04 MED ORDER — AMOXICILLIN-POT CLAVULANATE 875-125 MG PO TABS: 1.0000 | ORAL_TABLET | Freq: Two times a day (BID) | ORAL | Status: DC

## 2012-10-04 MED ORDER — CLINDAMYCIN HCL 300 MG PO CAPS: 300.0000 mg | ORAL_CAPSULE | Freq: Three times a day (TID) | ORAL | Status: DC

## 2012-10-04 MED ORDER — DIPHENHYDRAMINE HCL 25 MG PO TABS: 25.0000 mg | ORAL_TABLET | Freq: Four times a day (QID) | ORAL | Status: DC | PRN

## 2012-10-04 MED ORDER — HYDROMORPHONE HCL 2 MG PO TABS: 2.0000 mg | ORAL_TABLET | ORAL | Status: DC | PRN

## 2012-10-04 MED ORDER — PREDNISONE 50 MG PO TABS: ORAL_TABLET | ORAL | Status: DC

## 2012-10-04 NOTE — Progress Notes (Signed)
ANTIBIOTIC CONSULT NOTE - FOLLOW UP  Pharmacy Consult for Vancomycin, Zosyn Indication: Facial abscess  No Known Allergies  Patient Measurements: Height: 5\' 10"  (177.8 cm) Weight: 210 lb (95.255 kg) IBW/kg (Calculated) : 73   Vital Signs: Temp: 97.3 F (36.3 C) (03/08 0551) Temp src: Oral (03/08 0551) BP: 135/70 mmHg (03/08 0551) Pulse Rate: 93 (03/08 0551)  Labs:  Recent Labs  10/02/12 1115 10/03/12 0901 10/04/12 0721  WBC 13.8* 14.1*  --   HGB 16.0 15.8  --   PLT 202 227  --   CREATININE 0.89 0.87 0.79   Estimated Creatinine Clearance: 157.8 ml/min (by C-G formula based on Cr of 0.79).  Recent Labs  10/04/12 0721  VANCOTROUGH 9.6*     Microbiology: Recent Results (from the past 720 hour(s))  WOUND CULTURE     Status: None   Collection Time    10/02/12 10:55 AM      Result Value Range Status   Specimen Description WOUND LT LOWER LIP   Final   Special Requests Normal   Final   Gram Stain     Final   Value: NO WBC SEEN     NO SQUAMOUS EPITHELIAL CELLS SEEN     NO ORGANISMS SEEN   Culture     Final   Value: FEW STAPHYLOCOCCUS AUREUS     Note: RIFAMPIN AND GENTAMICIN SHOULD NOT BE USED AS SINGLE DRUGS FOR TREATMENT OF STAPH INFECTIONS.   Report Status PENDING   Incomplete    Anti-infectives   Start     Dose/Rate Route Frequency Ordered Stop   10/03/12 0000  vancomycin (VANCOCIN) IVPB 1000 mg/200 mL premix     1,000 mg 200 mL/hr over 60 Minutes Intravenous Every 8 hours 10/02/12 1446     10/02/12 1500  piperacillin-tazobactam (ZOSYN) IVPB 3.375 g     3.375 g 12.5 mL/hr over 240 Minutes Intravenous Every 8 hours 10/02/12 1445     10/02/12 1500  vancomycin (VANCOCIN) 1,500 mg in sodium chloride 0.9 % 500 mL IVPB     1,500 mg 250 mL/hr over 120 Minutes Intravenous  Once 10/02/12 1446 10/02/12 1702   10/02/12 1100  clindamycin (CLEOCIN) IVPB 600 mg     600 mg 100 mL/hr over 30 Minutes Intravenous  Once 10/02/12 1047 10/02/12 1229       Assessment:  31 YOM w facial abscess. Worsened sx and spreading despite I and D and Keflex and Septra (started 3/5) as outpatient.  Day # 3 vancomycin and zosyn  Vancomycin trough level = 9.6, below goal level.   Goal of Therapy:  Vancomycin trough level 15-20 mcg/ml Appropriate abx dosing, eradication of infection.  Plan:  Continue current Zosyn and vancomycin. Noted discharge plan for today on PO Augmentin.   Lynann Beaver PharmD, BCPS Pager [phone] 10/04/2012 9:10 AM

## 2012-10-04 NOTE — Progress Notes (Signed)
CRITICAL VALUE ALERT  Critical value received: MRSA positive wound culture   Date of notification:  10/04/12  Time of notification:  0950  Critical value read back:yes  Nurse who received alert: Eugene Garnet RN   MD notified (1st page):  Dr. Elisabeth Pigeon  Time of first page:  [phone]  Responding MD: Dr. Elisabeth Pigeon  Time MD responded:  [phone] Dr. Elisabeth Pigeon decided to change antibiotic that pt will be d/c'd with. Reflected in AVS report.

## 2012-10-04 NOTE — Progress Notes (Signed)
Pt ready for d/c. Informed pt that MD changed antibiotic at d/c to clindamycin. Informed that pt can pick this med up at [facility] on [address] per pt request. Educated family on good hand hygiene, infection control regarding MRSA, and how to prevent the spread among family members. Went over additional d/c instructions. Removed PIV, WNL. No change in pt condition since AM assessment. Pt d/c'd to home with mother and brother.

## 2012-10-04 NOTE — Discharge Summary (Addendum)
Physician Discharge Summary  Ernest Escobar:[id number] DOB: [birth year] DOA: 10/02/2012  PCP: No primary provider on file.  Admit date: 10/02/2012 Discharge date: 10/04/2012  Recommendations for Outpatient Follow-up:  1. Pt instructed to follow up with PCP if symptoms persist or worsen  Discharge Diagnoses:  Principal Problem:   Abscess Active Problems:   Lip swelling   Leukocytosis  Discharge Condition: medically stable for discharge home today  Diet recommendation: as tolerated  History of present illness:  32 year old male with past medical history of depression admitted 10/02/2012 for small chin abscess for 4 days prior to this admission. Patient did come to ED 1 day prior to this admission and this chin abscess was drained at that time and he was sent home on Keflex. There was no significant improvement and patient has noticed his lower lip started to swell.  In ED, CBC revealed mild leukocytosis of 13.8. Patient ws afebrile. His UDS was positive for THC and opiates.   Assessment and Plan:   Principal Problem:  *Abscess in the chin area  Started on vanco and zosyn. Initial wound culture with staph species Final report shows MRSA. MRI shows 7 mm lower lip abscess. Spoke with surgery and ENT on call and they both agreed this is too small of an abscess to be drained and to continue antibiotics and warm compresses.  Continue steroids and benadryl. Taper steroid from 50 mg a day to 0 mg by 10 mg a day Clindamycin 300 mg TID for 14 days on discharge Active Problems:  Leukocytosis  Secondary to chin abscess  D/C vanco and zosyn  Clindamycin on discharge for 2 weeks   Code Status: Full  Family Communication: Pt at bedside  Disposition Plan: home today  Manson Passey, MD  [facility]  Pager [phone]   Consultants:  None  Studies:  MRI TMJ/ chin 10/03/2012 - 7 mm abscess lower lip area Antibiotics:  vanco 10/02/2012 --> 10/04/2012 Zosyn 10/02/2012 --> 10/04/2012   Discharge  Exam: Filed Vitals:   10/04/12 0551  BP: 135/70  Pulse: 93  Temp: 97.3 F (36.3 C)  Resp: 18   Filed Vitals:   10/03/12 0537 10/03/12 1605 10/03/12 2151 10/04/12 0551  BP: 107/57 129/74 135/77 135/70  Pulse: 76 53 84 93  Temp: 97.5 F (36.4 C) 97.4 F (36.3 C) 98 F (36.7 C) 97.3 F (36.3 C)  TempSrc: Oral  Oral Oral  Resp: 16 18 16 18   Height:      Weight:      SpO2: 96% 97% 97% 97%    General: Pt is alert, follows commands appropriately, not in acute distress; chin abscess healing, no drainage Cardiovascular: Regular rate and rhythm, S1/S2 +, no murmurs, no rubs, no gallops Respiratory: Clear to auscultation bilaterally, no wheezing, no crackles, no rhonchi Abdominal: Soft, non tender, non distended, bowel sounds +, no guarding Extremities: no edema, no cyanosis, pulses palpable bilaterally DP and PT Neuro: Grossly nonfocal  Discharge Instructions  Discharge Orders   Future Orders Complete By Expires     Call MD for:  difficulty breathing, headache or visual disturbances  As directed     Call MD for:  persistant dizziness or light-headedness  As directed     Call MD for:  persistant nausea and vomiting  As directed     Call MD for:  severe uncontrolled pain  As directed     Diet - low sodium heart healthy  As directed     Increase activity slowly  As  directed         Medication List    STOP taking these medications       cephALEXin 500 MG capsule  Commonly known as:  KEFLEX     oxyCODONE-acetaminophen 5-325 MG per tablet  Commonly known as:  PERCOCET/ROXICET     sulfamethoxazole-trimethoprim 800-160 MG per tablet  Commonly known as:  BACTRIM DS,SEPTRA DS      TAKE these medications       amoxicillin-clavulanate 875-125 MG per tablet  Commonly known as:  AUGMENTIN  Take 1 tablet by mouth 2 (two) times daily.     diphenhydrAMINE 25 MG tablet  Commonly known as:  BENADRYL  Take 1 tablet (25 mg total) by mouth every 6 (six) hours as needed for itching.      HYDROmorphone 2 MG tablet  Commonly known as:  DILAUDID  Take 1 tablet (2 mg total) by mouth every 4 (four) hours as needed for pain.     ibuprofen 200 MG tablet  Commonly known as:  ADVIL,MOTRIN  Take 400 mg by mouth every 6 (six) hours as needed for pain.     predniSONE 50 MG tablet  Commonly known as:  DELTASONE  Please taper prednisone from 50 mg a day to 0 mg and taper by 10 mg a day     SEROQUEL 400 MG tablet  Generic drug:  QUEtiapine  Take 400 mg by mouth at bedtime.     QUEtiapine 25 MG tablet  Commonly known as:  SEROQUEL  Take 25 mg by mouth 2 (two) times daily.          The results of significant diagnostics from this hospitalization (including imaging, microbiology, ancillary and laboratory) are listed below for reference.    Significant Diagnostic Studies: Mr Face/trigeminal Wo/w Cm  10/03/2012  *RADIOLOGY REPORT*  Clinical Data: Chin  abscess.  Rule out osteomyelitis of the mandible.  RADIOLOGY EXAMINATION  Technique:  Multiplanar, multisequence MR imaging of the temporomandibular joint was performed following the standard protocol. No intravenous contrast was administered.  Contrast: 20mL MULTIHANCE GADOBENATE DIMEGLUMINE 529 MG/ML IV SOLN  Comparison: None.  Findings: Rim enhancing fluid collection in the soft tissues anterior to the mandible on the left compatible with abscess.  This is located in the soft tissues just below the lower lip and anterior to the mandible.  The abscess measures 7 mm in diameter. There is surrounding cellulitis with edema and enhancement in the adjacent soft tissues.  Negative for osteomyelitis of the mandible. No evidence of dental abscess.  This appears to be a primary dermal abscess.  There are reactive lymph nodes related to infection.  1 cm submental node on the left.  Left level II node measures 10 x 19 mm.  Scattered lymph nodes are present in the neck bilaterally which are not pathologically enlarged but could be related to acute  infection.  There is mucosal edema in the base of the left maxillary sinus compatible with chronic sinusitis.  No air-fluid levels in the sinus.  Visualized intracranial contents are normal.  Visualized parotid and submandibular glands are normal bilaterally.  IMPRESSION: 7 mm soft tissue abscess below the left lower lip.  There is surrounding cellulitis   Original Report Authenticated By: Janeece Riggers, M.D.     Microbiology: Recent Results (from the past 240 hour(s))  WOUND CULTURE     Status: None   Collection Time    10/02/12 10:55 AM      Result Value Range Status  Specimen Description WOUND LT LOWER LIP   Final   Special Requests Normal   Final   Gram Stain     Final   Value: NO WBC SEEN     NO SQUAMOUS EPITHELIAL CELLS SEEN     NO ORGANISMS SEEN   Culture     Final   Value: FEW STAPHYLOCOCCUS AUREUS     Note: RIFAMPIN AND GENTAMICIN SHOULD NOT BE USED AS SINGLE DRUGS FOR TREATMENT OF STAPH INFECTIONS.   Report Status PENDING   Incomplete     Labs: Basic Metabolic Panel:  Recent Labs Lab 10/02/12 1115 10/03/12 0901  NA 140 134*  K 4.2 4.2  CL 101 97  CO2 29 27  GLUCOSE 85 197*  BUN 15 11  CREATININE 0.89 0.87  CALCIUM 9.0 8.6   Liver Function Tests: No results found for this basename: AST, ALT, ALKPHOS, BILITOT, PROT, ALBUMIN,  in the last 168 hours No results found for this basename: LIPASE, AMYLASE,  in the last 168 hours No results found for this basename: AMMONIA,  in the last 168 hours CBC:  Recent Labs Lab 10/02/12 1115 10/03/12 0901  WBC 13.8* 14.1*  NEUTROABS 7.2  --   HGB 16.0 15.8  HCT 45.4 45.0  MCV 85.2 84.7  PLT 202 227   Cardiac Enzymes: No results found for this basename: CKTOTAL, CKMB, CKMBINDEX, TROPONINI,  in the last 168 hours BNP: BNP (last 3 results) No results found for this basename: PROBNP,  in the last 8760 hours CBG:  Recent Labs Lab 10/02/12 1119  GLUCAP 79    Time coordinating discharge: Over 30  minutes  Signed:  Manson Passey, MD  [facility]  10/04/2012, 8:15 AM  Pager #: [phone]

## 2012-10-08 ENCOUNTER — Encounter (HOSPITAL_COMMUNITY): Payer: Self-pay | Admitting: Emergency Medicine

## 2012-10-08 ENCOUNTER — Emergency Department (HOSPITAL_COMMUNITY): Payer: Self-pay

## 2012-10-08 ENCOUNTER — Emergency Department (HOSPITAL_COMMUNITY)
Admission: EM | Admit: 2012-10-08 | Discharge: 2012-10-08 | Disposition: A | Discharge status: Home / Self Care | Payer: Self-pay | Attending: Emergency Medicine | Admitting: Emergency Medicine

## 2012-10-08 DIAGNOSIS — R61 Generalized hyperhidrosis: Secondary | ICD-10-CM | POA: Insufficient documentation

## 2012-10-08 DIAGNOSIS — K089 Disorder of teeth and supporting structures, unspecified: Secondary | ICD-10-CM | POA: Insufficient documentation

## 2012-10-08 DIAGNOSIS — F17200 Nicotine dependence, unspecified, uncomplicated: Secondary | ICD-10-CM | POA: Insufficient documentation

## 2012-10-08 DIAGNOSIS — F1021 Alcohol dependence, in remission: Secondary | ICD-10-CM | POA: Insufficient documentation

## 2012-10-08 DIAGNOSIS — R51 Headache: Secondary | ICD-10-CM | POA: Insufficient documentation

## 2012-10-08 DIAGNOSIS — R6883 Chills (without fever): Secondary | ICD-10-CM | POA: Insufficient documentation

## 2012-10-08 DIAGNOSIS — R5381 Other malaise: Secondary | ICD-10-CM | POA: Insufficient documentation

## 2012-10-08 DIAGNOSIS — I10 Essential (primary) hypertension: Secondary | ICD-10-CM | POA: Insufficient documentation

## 2012-10-08 DIAGNOSIS — F319 Bipolar disorder, unspecified: Secondary | ICD-10-CM | POA: Insufficient documentation

## 2012-10-08 DIAGNOSIS — Z79899 Other long term (current) drug therapy: Secondary | ICD-10-CM | POA: Insufficient documentation

## 2012-10-08 DIAGNOSIS — K148 Other diseases of tongue: Secondary | ICD-10-CM | POA: Insufficient documentation

## 2012-10-08 DIAGNOSIS — M542 Cervicalgia: Secondary | ICD-10-CM | POA: Insufficient documentation

## 2012-10-08 DIAGNOSIS — Z8659 Personal history of other mental and behavioral disorders: Secondary | ICD-10-CM | POA: Insufficient documentation

## 2012-10-08 LAB — CBC WITH DIFFERENTIAL/PLATELET
Eosinophils Absolute: 0.2 10*3/uL (ref 0.0–0.7)
Hemoglobin: 17.7 g/dL — ABNORMAL HIGH (ref 13.0–17.0)
Lymphocytes Relative: 39 % (ref 12–46)
Lymphs Abs: 4 10*3/uL (ref 0.7–4.0)
MCH: 29.7 pg (ref 26.0–34.0)
Monocytes Relative: 14 % — ABNORMAL HIGH (ref 3–12)
Neutro Abs: 4.6 10*3/uL (ref 1.7–7.7)
Neutrophils Relative %: 45 % (ref 43–77)
RBC: 5.95 MIL/uL — ABNORMAL HIGH (ref 4.22–5.81)
WBC: 10.3 10*3/uL (ref 4.0–10.5)

## 2012-10-08 MED ORDER — CLINDAMYCIN HCL 150 MG PO CAPS: 300.0000 mg | ORAL_CAPSULE | Freq: Three times a day (TID) | ORAL | Status: DC

## 2012-10-08 MED ORDER — HYDROMORPHONE HCL PF 1 MG/ML IJ SOLN
1.0000 mg | Freq: Once | INTRAMUSCULAR | Status: AC
  Administered 2012-10-08: 1 mg via INTRAVENOUS
  Filled 2012-10-08: qty 1

## 2012-10-08 MED ORDER — IOHEXOL 300 MG/ML  SOLN
100.0000 mL | Freq: Once | INTRAMUSCULAR | Status: AC | PRN
  Administered 2012-10-08: 100 mL via INTRAVENOUS

## 2012-10-08 MED ORDER — SODIUM CHLORIDE 0.9 % IV SOLN
Freq: Once | INTRAVENOUS | Status: AC
  Administered 2012-10-08: 20:00:00 via INTRAVENOUS

## 2012-10-08 MED ORDER — IBUPROFEN 800 MG PO TABS: 800.0000 mg | ORAL_TABLET | Freq: Three times a day (TID) | ORAL | Status: DC

## 2012-10-08 MED ORDER — CLINDAMYCIN PHOSPHATE 600 MG/50ML IV SOLN
600.0000 mg | Freq: Once | INTRAVENOUS | Status: AC
  Administered 2012-10-08: 600 mg via INTRAVENOUS
  Filled 2012-10-08: qty 50

## 2012-10-08 MED ORDER — KETOROLAC TROMETHAMINE 15 MG/ML IJ SOLN
15.0000 mg | Freq: Once | INTRAMUSCULAR | Status: AC
  Administered 2012-10-08: 15 mg via INTRAVENOUS
  Filled 2012-10-08: qty 1

## 2012-10-08 NOTE — ED Provider Notes (Signed)
History     CSN: [id number]  Arrival date & time 10/08/12  1619   First MD Initiated Contact with Patient 10/08/12 1723      Chief Complaint  Patient presents with  . Tongue Problem     (Consider location/radiation/quality/duration/timing/severity/associated sxs/prior treatment) HPI Comments: Pt w chin abscess I & D on 10/01/2012 and dc w Keflex. Returned to ER for failed OP tx the next day adn was admitted for IV Vanc and Zosyn from 3/6-10/04/2012. At that time MRI  TMJ/ chin was performed showing 7 mm abscess lower lip area & Internist spoke with surgery and ENT on call and they both agreed this is too small of an abscess to be drained and to continue antibiotics and warm compresses.  Pt was then dc on Clinda 300 TID x 2 weeks of which he has been non compliant on completing.  Patient is a 32 y.o. male presenting with tooth pain. The history is provided by the patient.  Dental PainThe primary symptoms include mouth pain and headaches. Primary symptoms do not include fever, shortness of breath, sore throat or cough. The symptoms began 2 days ago. The symptoms are worsening. The symptoms occur constantly.  The headache began 2 days ago. Headache is a new problem. The pain from the headache is at a severity of 7/10. Location/region(s) of the headache: temporal. Associated with: mouth pain. The headache is not associated with neck stiffness.  Additional symptoms include: gum tenderness, excessive salivation and fatigue. Additional symptoms do not include: facial swelling, trouble swallowing, pain with swallowing, dry mouth and drooling.          Past Medical History  Diagnosis Date  . PTSD (post-traumatic stress disorder)   . Bipolar 1 disorder   . Alcohol abuse   . Hypertension     Past Surgical History  Procedure Laterality Date  . Appendectomy    . Heel spur surgery    . Foot surgery      Family History  Problem Relation Age of Onset  . Heart disease Father   . High blood  pressure Father     History  Substance Use Topics  . Smoking status: Current Every Day Smoker -- 1.00 packs/day    Types: Cigarettes  . Smokeless tobacco: Never Used  . Alcohol Use: No      Review of Systems  Constitutional: Positive for chills, diaphoresis and fatigue. Negative for fever.  HENT: Positive for neck pain. Negative for sore throat, facial swelling, drooling, mouth sores, trouble swallowing, neck stiffness, dental problem and voice change.   Respiratory: Negative for cough and shortness of breath.   Neurological: Positive for headaches.    Allergies  Review of patient's allergies indicates no known allergies.  Home Medications   Current Outpatient Rx  Name  Route  Sig  Dispense  Refill  . ibuprofen (ADVIL,MOTRIN) 200 MG tablet   Oral   Take 400 mg by mouth every 6 (six) hours as needed for pain.         Marland Kitchen QUEtiapine (SEROQUEL) 25 MG tablet   Oral   Take 25 mg by mouth 2 (two) times daily.         . QUEtiapine (SEROQUEL) 400 MG tablet   Oral   Take 400 mg by mouth at bedtime.            BP 167/99  Pulse 106  Temp(Src) 99 F (37.2 C) (Oral)  Resp 18  SpO2 100%  Physical Exam  Constitutional: He  appears well-developed and well-nourished.  Non-toxic appearance.  Mildly diaphoretic  HENT:  Head: Normocephalic and atraumatic. No trismus in the jaw.  Right Ear: External ear normal.  Left Ear: External ear normal.  Nose: No nasal deformity.  Mouth/Throat: Uvula is midline. He does not have dentures. No oral lesions. Normal dentition. No dental abscesses, edematous, lacerations or dental caries. No oropharyngeal exudate, posterior oropharyngeal edema, posterior oropharyngeal erythema or tonsillar abscesses.  Tenderness along gum lines and submandible and sublingual region.     Neck: Phonation normal. Neck supple. No tracheal deviation, no edema and no erythema present.  Cardiovascular: Normal rate, regular rhythm and normal heart sounds.    Pulmonary/Chest: Effort normal. No stridor. No respiratory distress.  Lymphadenopathy:    He has no cervical adenopathy.  Skin: He is diaphoretic.    ED Course  Procedures (including critical care time)  Labs Reviewed  CBC WITH DIFFERENTIAL - Abnormal; Notable for the following:    RBC 5.95 (*)    Hemoglobin 17.7 (*)    Monocytes Relative 14 (*)    Monocytes Absolute 1.4 (*)    All other components within normal limits   Ct Soft Tissue Neck W Contrast  10/08/2012  *RADIOLOGY REPORT*  Clinical Data: Pain.  MRSA on the chin.  Recent hospitalization for abscess.  CT NECK WITH CONTRAST  Technique:  Multidetector CT imaging of the neck was performed with intravenous contrast.  Contrast: OMNIPAQUE IOHEXOL 300 MG/ML  SOLN  Comparison: MRI of the neck 10/03/2012  Findings: Cutaneous and subcutaneous infiltration is noted at the level of the chin.  There is no discrete abscess.  No underlying osseous abnormality is evident.  Limited imaging of the brain is unremarkable.  No significant mucosal or submucosal lesions are present.  There is mild prominence of the adenoid tissue.  No significant submandibular disease is evident.  Small lymph nodes are stable.  The reactive sized level II lymph nodes are stable bilaterally.  The vocal cords are midline and symmetric.  The thyroid tissue is within normal limits.  The lung apices are clear.  The bone windows are unremarkable.  IMPRESSION:  1.  Cutaneous and subcutaneous inflammatory changes at the level of the mandible, centered anteriorly, without a discrete abscess. 2.  Similar appearance of the submandibular and level II reactive lymph nodes.   Original Report Authenticated By: Marin Roberts, M.D.      1. Abscess       MDM  32 yo male presented with mouth pain with fatigue and chills. Patient was recently admitted and treated for a lip and chin abscess. Patient has been non-complaint completing his antibiotic course. Based on MRI done  during previous admission that showed 7mm TMJ/chin abscess CT was performed to determine if infection had spread. CT as stated above does not support spreading infection. Based on exam, lab results with normal WBC, and imaging it was not deemed necessary to admit patient for further antibiotic workup but send him home to complete his clindamycin course. Patient was extensively counseled on the importance of completing his antibiotic course. He was told to return if he developed a fever, any swelling or draining over the abscess areas, or any other concerning symptoms.          Jeannetta Ellis, PA-C 10/08/12 2105

## 2012-10-08 NOTE — ED Provider Notes (Signed)
Medical screening examination/treatment/procedure(s) were conducted as a shared visit with non-physician practitioner(s) and myself.  I personally evaluated the patient during the encounter.  32 year old male with tongue pain. Patient with recent admission incision and drainage friction abscess. Patient was discharged antibiotics but has been noncompliant. HEENT having some pain about a day ago. Atraumatic. Grossly worsening. On exam he is in no acute distress. Posterior pharynx is clear. Uvula is midline. Handling secretions. No tongue elevation. Some mild tenderness is submental but no induration. Neck is supple. No stridor. CT with contrast was performed. Shows evidence of some mild inflammatory changes around his chin likely residual from his recent abscess. No discrete collection. Patient was given a dose of IV antibiotics the emergency room. Stressed the importance of compliance with his antibiotics and is to finish him as prescribed.  Return precautions were discussed.  [name], MD 10/08/12 2244

## 2012-10-08 NOTE — ED Notes (Signed)
Pt states that 2 nights ago, he noticed that his tongue was very sensitive.  He thought he had burned it or something.  Tongue became painful and pt has now been sweating and having cold chills.  Pt was admitted for an abscess on his lip and was d/c on the 8th.

## 2012-10-17 LAB — WOUND CULTURE

## 2012-11-01 ENCOUNTER — Emergency Department (HOSPITAL_COMMUNITY): Payer: Self-pay

## 2012-11-01 ENCOUNTER — Emergency Department (HOSPITAL_COMMUNITY)
Admission: EM | Admit: 2012-11-01 | Discharge: 2012-11-01 | Disposition: A | Discharge status: Home / Self Care | Payer: Self-pay | Attending: Emergency Medicine | Admitting: Emergency Medicine

## 2012-11-01 ENCOUNTER — Encounter (HOSPITAL_COMMUNITY): Payer: Self-pay | Admitting: Emergency Medicine

## 2012-11-01 DIAGNOSIS — IMO0002 Reserved for concepts with insufficient information to code with codable children: Secondary | ICD-10-CM | POA: Insufficient documentation

## 2012-11-01 DIAGNOSIS — F4310 Post-traumatic stress disorder, unspecified: Secondary | ICD-10-CM | POA: Insufficient documentation

## 2012-11-01 DIAGNOSIS — W208XXA Other cause of strike by thrown, projected or falling object, initial encounter: Secondary | ICD-10-CM | POA: Insufficient documentation

## 2012-11-01 DIAGNOSIS — Z79899 Other long term (current) drug therapy: Secondary | ICD-10-CM | POA: Insufficient documentation

## 2012-11-01 DIAGNOSIS — Y9389 Activity, other specified: Secondary | ICD-10-CM | POA: Insufficient documentation

## 2012-11-01 DIAGNOSIS — F17200 Nicotine dependence, unspecified, uncomplicated: Secondary | ICD-10-CM | POA: Insufficient documentation

## 2012-11-01 DIAGNOSIS — S6991XA Unspecified injury of right wrist, hand and finger(s), initial encounter: Secondary | ICD-10-CM

## 2012-11-01 DIAGNOSIS — I10 Essential (primary) hypertension: Secondary | ICD-10-CM | POA: Insufficient documentation

## 2012-11-01 DIAGNOSIS — F319 Bipolar disorder, unspecified: Secondary | ICD-10-CM | POA: Insufficient documentation

## 2012-11-01 DIAGNOSIS — T148XXA Other injury of unspecified body region, initial encounter: Secondary | ICD-10-CM

## 2012-11-01 DIAGNOSIS — Y9289 Other specified places as the place of occurrence of the external cause: Secondary | ICD-10-CM | POA: Insufficient documentation

## 2012-11-01 MED ORDER — IBUPROFEN 600 MG PO TABS: 600.0000 mg | ORAL_TABLET | Freq: Four times a day (QID) | ORAL | Status: DC | PRN

## 2012-11-01 MED ORDER — HYDROCODONE-ACETAMINOPHEN 5-325 MG PO TABS: ORAL_TABLET | ORAL | Status: DC

## 2012-11-01 MED ORDER — HYDROCODONE-ACETAMINOPHEN 5-325 MG PO TABS
1.0000 | ORAL_TABLET | Freq: Once | ORAL | Status: AC
  Administered 2012-11-01: 1 via ORAL
  Filled 2012-11-01: qty 1
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
## 2012-11-01 NOTE — ED Notes (Signed)
Pt has a ride home.  
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
## 2012-11-01 NOTE — ED Notes (Signed)
Pt did not want wound care. Pt instructed on wound care.

## 2012-11-02 NOTE — ED Provider Notes (Signed)
Medical screening examination/treatment/procedure(s) were performed by non-physician practitioner and as supervising physician I was immediately available for consultation/collaboration.    Marilu Rylander L Orlandis Sanden, MD 11/02/12 1549 

## 2012-12-09 ENCOUNTER — Emergency Department (HOSPITAL_BASED_OUTPATIENT_CLINIC_OR_DEPARTMENT_OTHER)
Admission: EM | Admit: 2012-12-09 | Discharge: 2012-12-09 | Disposition: A | Discharge status: Home / Self Care | Payer: Self-pay | Attending: Emergency Medicine | Admitting: Emergency Medicine

## 2012-12-09 ENCOUNTER — Encounter (HOSPITAL_BASED_OUTPATIENT_CLINIC_OR_DEPARTMENT_OTHER): Payer: Self-pay | Admitting: *Deleted

## 2012-12-09 DIAGNOSIS — R197 Diarrhea, unspecified: Secondary | ICD-10-CM | POA: Insufficient documentation

## 2012-12-09 DIAGNOSIS — M549 Dorsalgia, unspecified: Secondary | ICD-10-CM

## 2012-12-09 DIAGNOSIS — Z8659 Personal history of other mental and behavioral disorders: Secondary | ICD-10-CM | POA: Insufficient documentation

## 2012-12-09 DIAGNOSIS — Z79899 Other long term (current) drug therapy: Secondary | ICD-10-CM | POA: Insufficient documentation

## 2012-12-09 DIAGNOSIS — F17200 Nicotine dependence, unspecified, uncomplicated: Secondary | ICD-10-CM | POA: Insufficient documentation

## 2012-12-09 DIAGNOSIS — I10 Essential (primary) hypertension: Secondary | ICD-10-CM | POA: Insufficient documentation

## 2012-12-09 DIAGNOSIS — F319 Bipolar disorder, unspecified: Secondary | ICD-10-CM | POA: Insufficient documentation

## 2012-12-09 DIAGNOSIS — Z9889 Other specified postprocedural states: Secondary | ICD-10-CM | POA: Insufficient documentation

## 2012-12-09 DIAGNOSIS — R6883 Chills (without fever): Secondary | ICD-10-CM | POA: Insufficient documentation

## 2012-12-09 DIAGNOSIS — R112 Nausea with vomiting, unspecified: Secondary | ICD-10-CM | POA: Insufficient documentation

## 2012-12-09 DIAGNOSIS — M545 Low back pain, unspecified: Secondary | ICD-10-CM | POA: Insufficient documentation

## 2012-12-09 LAB — URINALYSIS, ROUTINE W REFLEX MICROSCOPIC
Glucose, UA: NEGATIVE mg/dL
Hgb urine dipstick: NEGATIVE
Leukocytes, UA: NEGATIVE
Specific Gravity, Urine: 1.034 — ABNORMAL HIGH (ref 1.005–1.030)
pH: 6 (ref 5.0–8.0)

## 2012-12-09 MED ORDER — SODIUM CHLORIDE 0.9 % IV BOLUS (SEPSIS)
1000.0000 mL | Freq: Once | INTRAVENOUS | Status: AC
  Administered 2012-12-09: 1000 mL via INTRAVENOUS

## 2012-12-09 MED ORDER — KETOROLAC TROMETHAMINE 30 MG/ML IJ SOLN
30.0000 mg | Freq: Once | INTRAMUSCULAR | Status: AC
  Administered 2012-12-09: 30 mg via INTRAVENOUS
  Filled 2012-12-09: qty 1

## 2012-12-09 MED ORDER — OXYCODONE-ACETAMINOPHEN 5-325 MG PO TABS: 1.0000 | ORAL_TABLET | Freq: Four times a day (QID) | ORAL | Status: DC | PRN

## 2012-12-09 MED ORDER — ONDANSETRON HCL 4 MG/2ML IJ SOLN
4.0000 mg | Freq: Once | INTRAMUSCULAR | Status: AC
  Administered 2012-12-09: 4 mg via INTRAVENOUS
  Filled 2012-12-09: qty 2

## 2012-12-09 MED ORDER — ONDANSETRON HCL 4 MG PO TABS: 4.0000 mg | ORAL_TABLET | Freq: Three times a day (TID) | ORAL | Status: DC | PRN

## 2012-12-09 MED ORDER — HYDROMORPHONE HCL PF 1 MG/ML IJ SOLN
0.5000 mg | Freq: Once | INTRAMUSCULAR | Status: AC
  Administered 2012-12-09: 0.5 mg via INTRAVENOUS
  Filled 2012-12-09: qty 1

## 2012-12-09 MED ORDER — DIAZEPAM 5 MG PO TABS: 5.0000 mg | ORAL_TABLET | Freq: Four times a day (QID) | ORAL | Status: DC | PRN

## 2012-12-09 NOTE — ED Notes (Signed)
MD at bedside. 
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
## 2012-12-09 NOTE — ED Provider Notes (Signed)
History     CSN: [id number]  Arrival date & time 12/09/12  1457   First MD Initiated Contact with Patient 12/09/12 1507      Chief Complaint  Patient presents with  . Back Pain    (Consider location/radiation/quality/duration/timing/severity/associated sxs/prior treatment) Patient is a 32 y.o. male presenting with back pain. The history is provided by the patient.  Back Pain Associated symptoms: no abdominal pain, no chest pain, no headaches, no numbness and no weakness    patient presents with back pain on the right side. He states that he lifted someone was heavy at the pool yesterday he developed right back pain. He states it is more severe shape. He states he's also had nausea vomiting and diarrhea. No fevers. He states he has felt sweaty. No cough. No dysuria. No lack of bladder or bowel control. No weakness. The pain is better with certain positions and worse with movement. He has not had previous back problems. His no known sick contacts.  Past Medical History  Diagnosis Date  . PTSD (post-traumatic stress disorder)   . Bipolar 1 disorder   . Alcohol abuse   . Hypertension     Past Surgical History  Procedure Laterality Date  . Appendectomy    . Heel spur surgery    . Foot surgery      Family History  Problem Relation Age of Onset  . Heart disease Father   . High blood pressure Father   . Diabetes Father     History  Substance Use Topics  . Smoking status: Current Every Day Smoker -- 1.00 packs/day    Types: Cigarettes  . Smokeless tobacco: Never Used  . Alcohol Use: No      Review of Systems  Constitutional: Positive for chills. Negative for activity change and appetite change.  HENT: Negative for neck stiffness.   Eyes: Negative for pain.  Respiratory: Negative for chest tightness and shortness of breath.   Cardiovascular: Negative for chest pain and leg swelling.  Gastrointestinal: Positive for nausea, vomiting and diarrhea. Negative for abdominal  pain.  Genitourinary: Negative for flank pain.  Musculoskeletal: Positive for back pain.  Skin: Negative for rash.  Neurological: Negative for weakness, numbness and headaches.  Psychiatric/Behavioral: Negative for behavioral problems.    Allergies  Review of patient's allergies indicates no known allergies.  Home Medications   Current Outpatient Rx  Name  Route  Sig  Dispense  Refill  . diazepam (VALIUM) 5 MG tablet   Oral   Take 1 tablet (5 mg total) by mouth every 6 (six) hours as needed (spasm).   10 tablet   0   . HYDROcodone-acetaminophen (NORCO/VICODIN) 5-325 MG per tablet      Take 1-2 tablets every 6 hours as needed for severe pain   12 tablet   0   . ibuprofen (ADVIL,MOTRIN) 600 MG tablet   Oral   Take 1 tablet (600 mg total) by mouth every 6 (six) hours as needed for pain.   20 tablet   0   . ondansetron (ZOFRAN) 4 MG tablet   Oral   Take 1 tablet (4 mg total) by mouth every 8 (eight) hours as needed for nausea.   10 tablet   0   . oxyCODONE-acetaminophen (PERCOCET/ROXICET) 5-325 MG per tablet   Oral   Take 1-2 tablets by mouth every 6 (six) hours as needed for pain.   15 tablet   0   . QUEtiapine (SEROQUEL) 25 MG tablet  Oral   Take 25 mg by mouth 2 (two) times daily.         . QUEtiapine (SEROQUEL) 400 MG tablet   Oral   Take 400 mg by mouth at bedtime.            BP 141/84  Pulse 64  Temp(Src) 97.6 F (36.4 C) (Oral)  Resp 18  Ht 5\' 10"  (1.778 m)  Wt 220 lb (99.791 kg)  BMI 31.57 kg/m2  SpO2 98%  Physical Exam  Nursing note and vitals reviewed. Constitutional: He is oriented to person, place, and time. He appears well-developed and well-nourished.  HENT:  Head: Normocephalic and atraumatic.  Eyes: EOM are normal. Pupils are equal, round, and reactive to light.  Neck: Normal range of motion. Neck supple.  Cardiovascular: Normal rate, regular rhythm and normal heart sounds.   No murmur heard. Pulmonary/Chest: Effort normal and  breath sounds normal.  Abdominal: Soft. Bowel sounds are normal. He exhibits no distension and no mass. There is no tenderness. There is no rebound and no guarding.  Musculoskeletal: Normal range of motion. He exhibits no edema.  Tenderness in right lower back. No rash. Worse with movement. No tenderness on his lumbar spine. Good straight leg raise bilaterally. Sensation intact over bilateral feet. Flexion-extension intact in feet.  Neurological: He is alert and oriented to person, place, and time. No cranial nerve deficit.  Skin: Skin is warm and dry.  Psychiatric: He has a normal mood and affect.    ED Course  Procedures (including critical care time)  Labs Reviewed  URINALYSIS, ROUTINE W REFLEX MICROSCOPIC - Abnormal; Notable for the following:    Specific Gravity, Urine 1.034 (*)    Ketones, ur 15 (*)    All other components within normal limits   No results found.   1. Back pain   2. Nausea vomiting and diarrhea       MDM  Patient with back pain after lifting something heavy yesterday. Also developed nausea vomiting diarrhea. Urinalysis shows only some dehydration. Patient feels better after treatment but still has some pain with movement. He is tolerated orals will be discharged home with pain medicines and antibiotics. He'll followup as needed        Juliet Rude. Rubin Payor, MD 12/09/12 1810

## 2012-12-09 NOTE — ED Notes (Signed)
Notified MD that patient was ambulatory to restroom twice without incident. Pt states the pain in  His low back continues to be worse especially when he gets up and moves around he does report that he is able to tolerate po fluids has had no vomiting since arriving here .

## 2012-12-14 ENCOUNTER — Encounter (HOSPITAL_BASED_OUTPATIENT_CLINIC_OR_DEPARTMENT_OTHER): Payer: Self-pay | Admitting: *Deleted

## 2012-12-14 ENCOUNTER — Emergency Department (HOSPITAL_BASED_OUTPATIENT_CLINIC_OR_DEPARTMENT_OTHER): Payer: Self-pay

## 2012-12-14 ENCOUNTER — Emergency Department (HOSPITAL_BASED_OUTPATIENT_CLINIC_OR_DEPARTMENT_OTHER)
Admission: EM | Admit: 2012-12-14 | Discharge: 2012-12-14 | Disposition: A | Discharge status: Home / Self Care | Payer: Self-pay | Attending: Emergency Medicine | Admitting: Emergency Medicine

## 2012-12-14 DIAGNOSIS — F17200 Nicotine dependence, unspecified, uncomplicated: Secondary | ICD-10-CM | POA: Insufficient documentation

## 2012-12-14 DIAGNOSIS — F4310 Post-traumatic stress disorder, unspecified: Secondary | ICD-10-CM | POA: Insufficient documentation

## 2012-12-14 DIAGNOSIS — IMO0002 Reserved for concepts with insufficient information to code with codable children: Secondary | ICD-10-CM | POA: Insufficient documentation

## 2012-12-14 DIAGNOSIS — Z87828 Personal history of other (healed) physical injury and trauma: Secondary | ICD-10-CM | POA: Insufficient documentation

## 2012-12-14 DIAGNOSIS — M5416 Radiculopathy, lumbar region: Secondary | ICD-10-CM

## 2012-12-14 DIAGNOSIS — F319 Bipolar disorder, unspecified: Secondary | ICD-10-CM | POA: Insufficient documentation

## 2012-12-14 DIAGNOSIS — I10 Essential (primary) hypertension: Secondary | ICD-10-CM | POA: Insufficient documentation

## 2012-12-14 DIAGNOSIS — Z79899 Other long term (current) drug therapy: Secondary | ICD-10-CM | POA: Insufficient documentation

## 2012-12-14 MED ORDER — ONDANSETRON 8 MG PO TBDP
8.0000 mg | ORAL_TABLET | Freq: Once | ORAL | Status: AC
  Administered 2012-12-14: 8 mg via ORAL
  Filled 2012-12-14: qty 1

## 2012-12-14 MED ORDER — HYDROMORPHONE HCL PF 1 MG/ML IJ SOLN
1.0000 mg | Freq: Once | INTRAMUSCULAR | Status: AC
  Administered 2012-12-14: 1 mg via INTRAMUSCULAR
  Filled 2012-12-14: qty 1

## 2012-12-14 MED ORDER — PREDNISONE 50 MG PO TABS: 50.0000 mg | ORAL_TABLET | Freq: Every day | ORAL | Status: DC

## 2012-12-14 MED ORDER — CYCLOBENZAPRINE HCL 10 MG PO TABS: 10.0000 mg | ORAL_TABLET | Freq: Three times a day (TID) | ORAL | Status: DC | PRN

## 2012-12-14 MED ORDER — KETOROLAC TROMETHAMINE 60 MG/2ML IM SOLN
60.0000 mg | Freq: Once | INTRAMUSCULAR | Status: AC
  Administered 2012-12-14: 60 mg via INTRAMUSCULAR
  Filled 2012-12-14: qty 2

## 2012-12-14 MED ORDER — PERCOCET 5-325 MG PO TABS: 1.0000 | ORAL_TABLET | Freq: Four times a day (QID) | ORAL | Status: DC | PRN

## 2012-12-14 MED ORDER — PREDNISONE 50 MG PO TABS
60.0000 mg | ORAL_TABLET | Freq: Once | ORAL | Status: AC
  Administered 2012-12-14: 60 mg via ORAL
  Filled 2012-12-14: qty 1

## 2012-12-14 MED ORDER — MORPHINE SULFATE 4 MG/ML IJ SOLN
6.0000 mg | Freq: Once | INTRAMUSCULAR | Status: AC
  Administered 2012-12-14: 6 mg via INTRAMUSCULAR
  Filled 2012-12-14: qty 2

## 2012-12-14 NOTE — ED Notes (Signed)
Pt states he was seen here Tuesday for a back injury. No better. "Worse"

## 2012-12-14 NOTE — ED Provider Notes (Signed)
Medical screening examination/treatment/procedure(s) were performed by non-physician practitioner and as supervising physician I was immediately available for consultation/collaboration.   Gurfateh Mcclain B. Bernette Mayers, MD 12/14/12 2333

## 2012-12-14 NOTE — ED Provider Notes (Signed)
History     CSN: [id number]  Arrival date & time 12/14/12  1612   First MD Initiated Contact with Patient 12/14/12 1634      Chief Complaint  Patient presents with  . Back Pain    (Consider location/radiation/quality/duration/timing/severity/associated sxs/prior treatment) HPI Patient presents to the emergency department with right lower back pain.  Patient, states he was here on Tuesday with similar symptoms.  Patient, states, that he picked up someone at the pool that day.  Patient, states the pain began immediately after picking up the individual.  Patient denies nausea, vomiting, abdominal pain, numbness, weakness, headache, blurred or shortness breath, incontinent or abnormal gait.  Patient, states, that movement and palpation make his pain, worse Past Medical History  Diagnosis Date  . PTSD (post-traumatic stress disorder)   . Bipolar 1 disorder   . Alcohol abuse   . Hypertension     Past Surgical History  Procedure Laterality Date  . Appendectomy    . Heel spur surgery    . Foot surgery      Family History  Problem Relation Age of Onset  . Heart disease Father   . High blood pressure Father   . Diabetes Father     History  Substance Use Topics  . Smoking status: Current Every Day Smoker -- 1.00 packs/day    Types: Cigarettes  . Smokeless tobacco: Never Used  . Alcohol Use: No      Review of Systems All other systems negative except as documented in the HPI. All pertinent positives and negatives as reviewed in the HPI. Allergies  Review of patient's allergies indicates no known allergies.  Home Medications   Current Outpatient Rx  Name  Route  Sig  Dispense  Refill  . diazepam (VALIUM) 5 MG tablet   Oral   Take 1 tablet (5 mg total) by mouth every 6 (six) hours as needed (spasm).   10 tablet   0   . HYDROcodone-acetaminophen (NORCO/VICODIN) 5-325 MG per tablet      Take 1-2 tablets every 6 hours as needed for severe pain   12 tablet   0   .  ibuprofen (ADVIL,MOTRIN) 600 MG tablet   Oral   Take 1 tablet (600 mg total) by mouth every 6 (six) hours as needed for pain.   20 tablet   0   . ondansetron (ZOFRAN) 4 MG tablet   Oral   Take 1 tablet (4 mg total) by mouth every 8 (eight) hours as needed for nausea.   10 tablet   0   . oxyCODONE-acetaminophen (PERCOCET/ROXICET) 5-325 MG per tablet   Oral   Take 1-2 tablets by mouth every 6 (six) hours as needed for pain.   15 tablet   0   . QUEtiapine (SEROQUEL) 25 MG tablet   Oral   Take 25 mg by mouth 2 (two) times daily.         . QUEtiapine (SEROQUEL) 400 MG tablet   Oral   Take 400 mg by mouth at bedtime.            BP 143/77  Pulse 89  Temp(Src) 97.5 F (36.4 C) (Oral)  Resp 18  Ht 5\' 10"  (1.778 m)  Wt 220 lb (99.791 kg)  BMI 31.57 kg/m2  SpO2 100%  Physical Exam  Nursing note and vitals reviewed. Constitutional: He is oriented to person, place, and time. He appears well-developed and well-nourished. No distress.  HENT:  Head: Normocephalic and atraumatic.  Eyes:  Pupils are equal, round, and reactive to light.  Cardiovascular: Normal rate and regular rhythm.  Exam reveals no gallop and no friction rub.   No murmur heard. Pulmonary/Chest: Effort normal and breath sounds normal. No respiratory distress.  Musculoskeletal:       Lumbar back: He exhibits tenderness and pain. He exhibits normal range of motion, no deformity and no spasm.       Back:  Neurological: He is alert and oriented to person, place, and time. He has normal strength. He displays normal reflexes. No sensory deficit. He exhibits normal muscle tone. Coordination and gait normal.  Reflex Scores:      Patellar reflexes are 2+ on the right side and 2+ on the left side.      Achilles reflexes are 2+ on the right side and 2+ on the left side. Skin: Skin is warm and dry.    ED Course  Procedures (including critical care time)  Labs Reviewed - No data to display Dg Lumbar Spine  Complete  12/14/2012   *RADIOLOGY REPORT*  Clinical Data: Low back pain.  LUMBAR SPINE - COMPLETE 4+ VIEW  Comparison: None.  Findings: There is no fracture, subluxation, spondylolisthesis or spondylolysis, or disc space narrowing. There is a small limbus deformity of the anterior-superior aspect of the L4 vertebra, not significant.  IMPRESSION: No significant abnormality.   Original Report Authenticated By: Francene Boyers, M.D.    Patient be treated for lumbar radiculopathy and referred to neurosurgery, as needed.  Also advised him to followup with his primary care Dr. advised the patient to use ice and heat on his lower back.  He will be given steroids, along with pain control.  Told to return here as needed    MDM          Carlyle Dolly, PA-C 12/14/12 1842  Carlyle Dolly, PA-C 12/14/12 1844

## 2012-12-27 ENCOUNTER — Emergency Department (HOSPITAL_COMMUNITY): Payer: Self-pay

## 2012-12-27 ENCOUNTER — Encounter (HOSPITAL_COMMUNITY): Payer: Self-pay | Admitting: Emergency Medicine

## 2012-12-27 ENCOUNTER — Emergency Department (HOSPITAL_COMMUNITY)
Admission: EM | Admit: 2012-12-27 | Discharge: 2012-12-27 | Disposition: A | Discharge status: Home / Self Care | Payer: Self-pay | Attending: Emergency Medicine | Admitting: Emergency Medicine

## 2012-12-27 DIAGNOSIS — R112 Nausea with vomiting, unspecified: Secondary | ICD-10-CM | POA: Insufficient documentation

## 2012-12-27 DIAGNOSIS — R109 Unspecified abdominal pain: Secondary | ICD-10-CM | POA: Insufficient documentation

## 2012-12-27 DIAGNOSIS — F4310 Post-traumatic stress disorder, unspecified: Secondary | ICD-10-CM | POA: Insufficient documentation

## 2012-12-27 DIAGNOSIS — F17200 Nicotine dependence, unspecified, uncomplicated: Secondary | ICD-10-CM | POA: Insufficient documentation

## 2012-12-27 DIAGNOSIS — Z79899 Other long term (current) drug therapy: Secondary | ICD-10-CM | POA: Insufficient documentation

## 2012-12-27 DIAGNOSIS — F319 Bipolar disorder, unspecified: Secondary | ICD-10-CM | POA: Insufficient documentation

## 2012-12-27 DIAGNOSIS — M545 Low back pain, unspecified: Secondary | ICD-10-CM | POA: Insufficient documentation

## 2012-12-27 DIAGNOSIS — Z9089 Acquired absence of other organs: Secondary | ICD-10-CM | POA: Insufficient documentation

## 2012-12-27 DIAGNOSIS — I10 Essential (primary) hypertension: Secondary | ICD-10-CM | POA: Insufficient documentation

## 2012-12-27 DIAGNOSIS — R61 Generalized hyperhidrosis: Secondary | ICD-10-CM | POA: Insufficient documentation

## 2012-12-27 DIAGNOSIS — Z87828 Personal history of other (healed) physical injury and trauma: Secondary | ICD-10-CM | POA: Insufficient documentation

## 2012-12-27 DIAGNOSIS — M549 Dorsalgia, unspecified: Secondary | ICD-10-CM

## 2012-12-27 LAB — URINALYSIS, ROUTINE W REFLEX MICROSCOPIC
Bilirubin Urine: NEGATIVE
Hgb urine dipstick: NEGATIVE
Specific Gravity, Urine: 1.026 (ref 1.005–1.030)
Urobilinogen, UA: 0.2 mg/dL (ref 0.0–1.0)

## 2012-12-27 MED ORDER — IBUPROFEN 800 MG PO TABS: 800.0000 mg | ORAL_TABLET | Freq: Three times a day (TID) | ORAL | Status: DC

## 2012-12-27 MED ORDER — DIAZEPAM 5 MG PO TABS: 5.0000 mg | ORAL_TABLET | Freq: Four times a day (QID) | ORAL | Status: DC | PRN

## 2012-12-27 MED ORDER — ONDANSETRON 8 MG PO TBDP
8.0000 mg | ORAL_TABLET | Freq: Once | ORAL | Status: AC
  Administered 2012-12-27: 8 mg via ORAL
  Filled 2012-12-27: qty 1

## 2012-12-27 MED ORDER — OXYCODONE-ACETAMINOPHEN 5-325 MG PO TABS
1.0000 | ORAL_TABLET | Freq: Once | ORAL | Status: AC
  Administered 2012-12-27: 1 via ORAL
  Filled 2012-12-27: qty 1

## 2012-12-27 MED ORDER — OXYCODONE-ACETAMINOPHEN 5-325 MG PO TABS: 1.0000 | ORAL_TABLET | ORAL | Status: DC | PRN

## 2012-12-27 NOTE — ED Provider Notes (Signed)
History  This chart was scribed for non-physician practitioner Jaynie Crumble, PA-C working with Ernest Razor, MD, by Candelaria Stagers, ED Scribe. This patient was seen in room WTR7/WTR7 and the patient's care was started at 3:22 PM   CSN: [id number]  Arrival date & time 12/27/12  1336   First MD Initiated Contact with Patient 12/27/12 1507      Chief Complaint  Patient presents with  . Back Pain    The history is provided by the patient. No language interpreter was used.   HPI Comments: Ernest Escobar is a 32 y.o. male who presents to the Emergency Department complaining of non-radiating lower back pain that started three weeks ago after he injured his back while picking up a girl at the pool.  He has been seen in the ED several times for the pain and was referred to a neurosurgeon and reports he has not followed up due to lack of insurance.  He is experiencing intermittent nausea, vomiting, and diaphoresis.  He denies bladder incontinence or loss of bowel control.  Pt reports the pain is worse with movement.    Past Medical History  Diagnosis Date  . PTSD (post-traumatic stress disorder)   . Bipolar 1 disorder   . Alcohol abuse   . Hypertension     Past Surgical History  Procedure Laterality Date  . Appendectomy    . Heel spur surgery    . Foot surgery      Family History  Problem Relation Age of Onset  . Heart disease Father   . High blood pressure Father   . Diabetes Father     History  Substance Use Topics  . Smoking status: Current Every Day Smoker -- 1.00 packs/day    Types: Cigarettes  . Smokeless tobacco: Never Used  . Alcohol Use: No      Review of Systems  Gastrointestinal: Positive for nausea.  Genitourinary: Negative for hematuria.  Musculoskeletal: Positive for back pain.  All other systems reviewed and are negative.    Allergies  Review of patient's allergies indicates no known allergies.  Home Medications   Current Outpatient Rx   Name  Route  Sig  Dispense  Refill  . cyclobenzaprine (FLEXERIL) 10 MG tablet   Oral   Take 1 tablet (10 mg total) by mouth 3 (three) times daily as needed for muscle spasms.   15 tablet   0   . diazepam (VALIUM) 5 MG tablet   Oral   Take 1 tablet (5 mg total) by mouth every 6 (six) hours as needed (spasm).   10 tablet   0   . hydrOXYzine (ATARAX/VISTARIL) 25 MG tablet   Oral   Take 25 mg by mouth 2 (two) times daily.         Marland Kitchen ibuprofen (ADVIL,MOTRIN) 800 MG tablet   Oral   Take 800 mg by mouth every 8 (eight) hours as needed for pain.         Marland Kitchen ondansetron (ZOFRAN) 4 MG tablet   Oral   Take 1 tablet (4 mg total) by mouth every 8 (eight) hours as needed for nausea.   10 tablet   0   . PERCOCET 5-325 MG per tablet   Oral   Take 1 tablet by mouth every 6 (six) hours as needed for pain.   15 tablet   0     Dispense as written.   . predniSONE (DELTASONE) 50 MG tablet   Oral   Take 1  tablet (50 mg total) by mouth daily.   5 tablet   0   . sertraline (ZOLOFT) 100 MG tablet   Oral   Take 100 mg by mouth daily.           BP 145/90  Pulse 94  Temp(Src) 98.5 F (36.9 C) (Oral)  Ht 5\' 10"  (1.778 m)  Wt 220 lb (99.791 kg)  BMI 31.57 kg/m2  SpO2 97%  Physical Exam  Nursing note and vitals reviewed. Constitutional: He is oriented to person, place, and time. He appears well-developed and well-nourished. No distress.  HENT:  Head: Normocephalic and atraumatic.  Eyes: EOM are normal.  Neck: Neck supple. No tracheal deviation present.  Cardiovascular: Normal rate.   Pulmonary/Chest: Effort normal. No respiratory distress.  Musculoskeletal: Normal range of motion. He exhibits tenderness.  Right para vertebral lumbar tenderness.  No midline lumbar tenderness.  No pain with right straight leg raise.  Normal reflexes.   Neurological: He is alert and oriented to person, place, and time.  Skin: Skin is warm and dry.  Psychiatric: He has a normal mood and affect.  His behavior is normal.    ED Course  Procedures   DIAGNOSTIC STUDIES: Oxygen Saturation is 97% on room air, normal by my interpretation.    COORDINATION OF CARE:  3:28 PM Discussed course of care with pt which includes urinalysis and abdomen CT.  Will provide referral to orthopaedist.  Pt understands and agrees.   4:43 PM Discussed images with pt.  Will provide referral to orthopedist.    Labs Reviewed  URINALYSIS, ROUTINE W REFLEX MICROSCOPIC   Ct Abdomen Pelvis Wo Contrast  12/27/2012   *RADIOLOGY REPORT*  Clinical Data:  Right-sided flank/back pain.  Trauma 2 weeks ago.  CT ABDOMEN AND PELVIS WITHOUT CONTRAST (CT UROGRAM)  Technique: Contiguous axial images of the abdomen and pelvis without oral or intravenous contrast were obtained.  Comparison: 12/14/2012 lumbar spine plain films.  Findings:  Exam is limited for evaluation of entities other than urinary tract calculi due to lack of oral or intravenous contrast.   Lung bases:  Anterior right lung base 3 mm nodule which is likely post infectious or inflammatory in this age group.  Mild motion degradation at the lung bases. Normal heart size without pericardial or pleural effusion.  Abdomen/pelvis:  Normal liver, spleen, stomach, pancreas, gallbladder, biliary tract, adrenal glands.  No renal calculi or hydronephrosis.  No hydroureter or ureteric calculi.  Mild motion degradation, continuing into the abdomen.  No retroperitoneal or retrocrural adenopathy.  Scattered colonic diverticula.  Normal terminal ileum.  Prior appendectomy. Normal small bowel without abdominal ascites.  No pelvic adenopathy.    Normal urinary bladder and prostate.  No significant free fluid.  Bones/Musculoskeletal:   No acute osseous abnormality.  Limbus type vertebrae at L4.  IMPRESSION:  1. No urinary tract calculi or hydronephrosis. 2.  Mild motion degradation.  Given this factor, no other explanation for pain.   Original Report Authenticated By: Jeronimo Greaves, M.D.      1. Back pain       MDM  Pt with persistent right lower back pain and flank pain for the last 2 weeks after lifting another person. Pt already seen twice at [facility], treated with steroid taper, flexeril, percocet, with no improvement. He was given a referral for follow up but unable because has no insurance. He has no neuro deficits. No signs of cauda equina. Pt states his pain is causing him to have nausea and  intermittent vomiting. Pt's UA and CT abd/plevis obtained to r/o kidney problem or kidney stone and is negative. Pt's vs are normal other than mildly elevated blood pressure. Plan to d/c home with percocet, ibuprofen, valium, follow up with a specialist. Pt asked for referral to orthopedics, will give that referral.   Filed Vitals:   12/27/12 1352 12/27/12 1712  BP: 145/90 140/89  Pulse: 94 70  Temp: 98.5 F (36.9 C)   TempSrc: Oral   Resp:  18  Height: 5\' 10"  (1.778 m)   Weight: 220 lb (99.791 kg)   SpO2: 97% 99%    I personally performed the services described in this documentation, which was scribed in my presence. The recorded information has been reviewed and is accurate.        Lottie Mussel, PA-C 12/28/12 [phone]

## 2012-12-27 NOTE — ED Notes (Signed)
Pt was seen at [facility] for same problem. Pt sts he had has no improvement.

## 2013-01-01 NOTE — ED Provider Notes (Signed)
Medical screening examination/treatment/procedure(s) were performed by non-physician practitioner and as supervising physician I was immediately available for consultation/collaboration.  Jannine Abreu, MD 01/01/13 0529 

## 2015-01-08 ENCOUNTER — Encounter (HOSPITAL_COMMUNITY): Payer: Self-pay | Admitting: Emergency Medicine

## 2015-01-08 ENCOUNTER — Emergency Department (HOSPITAL_COMMUNITY)
Admission: EM | Admit: 2015-01-08 | Discharge: 2015-01-08 | Disposition: A | Discharge status: Home / Self Care | Payer: Self-pay | Attending: Emergency Medicine | Admitting: Emergency Medicine

## 2015-01-08 DIAGNOSIS — M5442 Lumbago with sciatica, left side: Secondary | ICD-10-CM | POA: Insufficient documentation

## 2015-01-08 DIAGNOSIS — F4310 Post-traumatic stress disorder, unspecified: Secondary | ICD-10-CM | POA: Insufficient documentation

## 2015-01-08 DIAGNOSIS — Z79899 Other long term (current) drug therapy: Secondary | ICD-10-CM | POA: Insufficient documentation

## 2015-01-08 DIAGNOSIS — Z720 Tobacco use: Secondary | ICD-10-CM | POA: Insufficient documentation

## 2015-01-08 DIAGNOSIS — I10 Essential (primary) hypertension: Secondary | ICD-10-CM | POA: Insufficient documentation

## 2015-01-08 DIAGNOSIS — F319 Bipolar disorder, unspecified: Secondary | ICD-10-CM | POA: Insufficient documentation

## 2015-01-08 MED ORDER — DIAZEPAM 5 MG PO TABS
5.0000 mg | ORAL_TABLET | Freq: Once | ORAL | Status: AC
  Administered 2015-01-08: 5 mg via ORAL
  Filled 2015-01-08: qty 1

## 2015-01-08 MED ORDER — MELOXICAM 15 MG PO TABS: 15.0000 mg | ORAL_TABLET | Freq: Every day | ORAL | Status: DC

## 2015-01-08 MED ORDER — HYDROCODONE-ACETAMINOPHEN 5-325 MG PO TABS
2.0000 | ORAL_TABLET | Freq: Once | ORAL | Status: AC
  Administered 2015-01-08: 2 via ORAL
  Filled 2015-01-08: qty 2

## 2015-01-08 MED ORDER — DIAZEPAM 5 MG PO TABS: 5.0000 mg | ORAL_TABLET | Freq: Two times a day (BID) | ORAL | Status: DC | PRN

## 2015-01-08 NOTE — Discharge Instructions (Signed)
Take Valium as needed as directed for muscle spasm. No driving or operating heavy machinery while taking valium. This medication may cause drowsiness. Rest, apply ice intermittently for the next 24 hours followed by heat. Avoid heavy lifting or hard physical activity. Take meloxicam once daily with food. Do not take any ibuprofen or naproxen with this medication. Back Pain, Adult Low back pain is very common. About 1 in 5 people have back pain.The cause of low back pain is rarely dangerous. The pain often gets better over time.About half of people with a sudden onset of back pain feel better in just 2 weeks. About 8 in 10 people feel better by 6 weeks.  CAUSES Some common causes of back pain include:  Strain of the muscles or ligaments supporting the spine.  Wear and tear (degeneration) of the spinal discs.  Arthritis.  Direct injury to the back. DIAGNOSIS Most of the time, the direct cause of low back pain is not known.However, back pain can be treated effectively even when the exact cause of the pain is unknown.Answering your caregiver's questions about your overall health and symptoms is one of the most accurate ways to make sure the cause of your pain is not dangerous. If your caregiver needs more information, he or she may order lab work or imaging tests (X-rays or MRIs).However, even if imaging tests show changes in your back, this usually does not require surgery. HOME CARE INSTRUCTIONS For many people, back pain returns.Since low back pain is rarely dangerous, it is often a condition that people can learn to [facility] their own.   Remain active. It is stressful on the back to sit or stand in one place. Do not sit, drive, or stand in one place for more than 30 minutes at a time. Take short walks on level surfaces as soon as pain allows.Try to increase the length of time you walk each day.  Do not stay in bed.Resting more than 1 or 2 days can delay your recovery.  Do not avoid  exercise or work.Your body is made to move.It is not dangerous to be active, even though your back may hurt.Your back will likely heal faster if you return to being active before your pain is gone.  Pay attention to your body when you bend and lift. Many people have less discomfortwhen lifting if they bend their knees, keep the load close to their bodies,and avoid twisting. Often, the most comfortable positions are those that put less stress on your recovering back.  Find a comfortable position to sleep. Use a firm mattress and lie on your side with your knees slightly bent. If you lie on your back, put a pillow under your knees.  Only take over-the-counter or prescription medicines as directed by your caregiver. Over-the-counter medicines to reduce pain and inflammation are often the most helpful.Your caregiver may prescribe muscle relaxant drugs.These medicines help dull your pain so you can more quickly return to your normal activities and healthy exercise.  Put ice on the injured area.  Put ice in a plastic bag.  Place a towel between your skin and the bag.  Leave the ice on for 15-20 minutes, 03-04 times a day for the first 2 to 3 days. After that, ice and heat may be alternated to reduce pain and spasms.  Ask your caregiver about trying back exercises and gentle massage. This may be of some benefit.  Avoid feeling anxious or stressed.Stress increases muscle tension and can worsen back pain.It is important  to recognize when you are anxious or stressed and learn ways to manage it.Exercise is a great option. SEEK MEDICAL CARE IF:  You have pain that is not relieved with rest or medicine.  You have pain that does not improve in 1 week.  You have new symptoms.  You are generally not feeling well. SEEK IMMEDIATE MEDICAL CARE IF:   You have pain that radiates from your back into your legs.  You develop new bowel or bladder control problems.  You have unusual weakness or  numbness in your arms or legs.  You develop nausea or vomiting.  You develop abdominal pain.  You feel faint. Document Released: 07/16/2005 Document Revised: 01/15/2012 Document Reviewed: 11/17/2013 [facility] Patient Information 2015 [place], [place]. This information is not intended to replace advice given to you by your health care provider. Make sure you discuss any questions you have with your health care provider.  Sciatica Sciatica is pain, weakness, numbness, or tingling along the path of the sciatic nerve. The nerve starts in the lower back and runs down the back of each leg. The nerve controls the muscles in the lower leg and in the back of the knee, while also providing sensation to the back of the thigh, lower leg, and the sole of your foot. Sciatica is a symptom of another medical condition. For instance, nerve damage or certain conditions, such as a herniated disk or bone spur on the spine, pinch or put pressure on the sciatic nerve. This causes the pain, weakness, or other sensations normally associated with sciatica. Generally, sciatica only affects one side of the body. CAUSES   Herniated or slipped disc.  Degenerative disk disease.  A pain disorder involving the narrow muscle in the buttocks (piriformis syndrome).  Pelvic injury or fracture.  Pregnancy.  Tumor (rare). SYMPTOMS  Symptoms can vary from mild to very severe. The symptoms usually travel from the low back to the buttocks and down the back of the leg. Symptoms can include:  Mild tingling or dull aches in the lower back, leg, or hip.  Numbness in the back of the calf or sole of the foot.  Burning sensations in the lower back, leg, or hip.  Sharp pains in the lower back, leg, or hip.  Leg weakness.  Severe back pain inhibiting movement. These symptoms may get worse with coughing, sneezing, laughing, or prolonged sitting or standing. Also, being overweight may worsen symptoms. DIAGNOSIS  Your caregiver  will perform a physical exam to look for common symptoms of sciatica. He or she may ask you to do certain movements or activities that would trigger sciatic nerve pain. Other tests may be performed to find the cause of the sciatica. These may include:  Blood tests.  X-rays.  Imaging tests, such as an MRI or CT scan. TREATMENT  Treatment is directed at the cause of the sciatic pain. Sometimes, treatment is not necessary and the pain and discomfort goes away on its own. If treatment is needed, your caregiver may suggest:  Over-the-counter medicines to relieve pain.  Prescription medicines, such as anti-inflammatory medicine, muscle relaxants, or narcotics.  Applying heat or ice to the painful area.  Steroid injections to lessen pain, irritation, and inflammation around the nerve.  Reducing activity during periods of pain.  Exercising and stretching to strengthen your abdomen and improve flexibility of your spine. Your caregiver may suggest losing weight if the extra weight makes the back pain worse.  Physical therapy.  Surgery to eliminate what is pressing  or pinching the nerve, such as a bone spur or part of a herniated disk. HOME CARE INSTRUCTIONS   Only take over-the-counter or prescription medicines for pain or discomfort as directed by your caregiver.  Apply ice to the affected area for 20 minutes, 3-4 times a day for the first 48-72 hours. Then try heat in the same way.  Exercise, stretch, or perform your usual activities if these do not aggravate your pain.  Attend physical therapy sessions as directed by your caregiver.  Keep all follow-up appointments as directed by your caregiver.  Do not wear high heels or shoes that do not provide proper support.  Check your mattress to see if it is too soft. A firm mattress may lessen your pain and discomfort. SEEK IMMEDIATE MEDICAL CARE IF:   You lose control of your bowel or bladder (incontinence).  You have increasing  weakness in the lower back, pelvis, buttocks, or legs.  You have redness or swelling of your back.  You have a burning sensation when you urinate.  You have pain that gets worse when you lie down or awakens you at night.  Your pain is worse than you have experienced in the past.  Your pain is lasting longer than 4 weeks.  You are suddenly losing weight without reason. MAKE SURE YOU:  Understand these instructions.  Will watch your condition.  Will get help right away if you are not doing well or get worse. Document Released: 07/10/2001 Document Revised: 01/15/2012 Document Reviewed: 11/25/2011 [facility] Patient Information 2015 [place], [place]. This information is not intended to replace advice given to you by your health care provider. Make sure you discuss any questions you have with your health care provider.

## 2015-01-08 NOTE — ED Notes (Signed)
Pt c/o lower back pain.  States he strained his back while trying to change a motor in his boat this morning.  9/10 on pain scale

## 2015-01-08 NOTE — ED Notes (Signed)
Pt lifted heavy object when working on boat this am. Pt began to have Lower back pain worse on L side radiating down leg. Hx of back pain.

## 2015-01-08 NOTE — ED Provider Notes (Signed)
CSN: [id number]     Arrival date & time 01/08/15  1215 History  This chart was scribed for non-physician practitioner, Kathrynn Speed, PA-C, working with Toy Cookey, MD, by Ronney Lion, ED Scribe. This patient was seen in room WTR7/WTR7 and the patient's care was started at 12:25 PM.    Chief Complaint  Patient presents with  . Back Pain   The history is provided by the patient. No language interpreter was used.    HPI Comments: Ernest Escobar is a 34 y.o. male with a PMHx of HTN, PTSD, and Bipolar 1, who presents to the Emergency Department complaining of constant, severe, left mid back pain that started about 4 hours ago after moving a heavy engine on his boat this morning. Pain worse with certain movements, and when he moves, he gets a "lightning bolt" pain that then radiates down his left leg into his foot. If he sits with his back slightly hunched, his pain is somewhat relieved. He has not tried any medication for his pain. Patient states he has a history of muscle problems around the same area on his back about 2 years ago, but it has never been this severe or persistent. His pain is worse with movement. Patient has NKDA. Denies loss of control bowels or bladder saddle anesthesia.  Past Medical History  Diagnosis Date  . PTSD (post-traumatic stress disorder)   . Bipolar 1 disorder   . Alcohol abuse   . Hypertension    Past Surgical History  Procedure Laterality Date  . Appendectomy    . Heel spur surgery    . Foot surgery     Family History  Problem Relation Age of Onset  . Heart disease Father   . High blood pressure Father   . Diabetes Father    History  Substance Use Topics  . Smoking status: Current Every Day Smoker -- 1.00 packs/day    Types: Cigarettes  . Smokeless tobacco: Never Used  . Alcohol Use: No    Review of Systems  Musculoskeletal: Positive for back pain.  All other systems reviewed and are negative.  Allergies  Review of patient's allergies  indicates no known allergies.  Home Medications   Prior to Admission medications   Medication Sig Start Date End Date Taking? Authorizing Provider  cyclobenzaprine (FLEXERIL) 10 MG tablet Take 1 tablet (10 mg total) by mouth 3 (three) times daily as needed for muscle spasms. 12/14/12   Charlestine Night, PA-C  diazepam (VALIUM) 5 MG tablet Take 1 tablet (5 mg total) by mouth every 12 (twelve) hours as needed for muscle spasms. 01/08/15   Kathrynn Speed, PA-C  hydrOXYzine (ATARAX/VISTARIL) 25 MG tablet Take 25 mg by mouth 2 (two) times daily.    Historical Provider, MD  ibuprofen (ADVIL,MOTRIN) 800 MG tablet Take 800 mg by mouth every 8 (eight) hours as needed for pain.    Historical Provider, MD  ibuprofen (ADVIL,MOTRIN) 800 MG tablet Take 1 tablet (800 mg total) by mouth 3 (three) times daily. 12/27/12   Tatyana Kirichenko, PA-C  meloxicam (MOBIC) 15 MG tablet Take 1 tablet (15 mg total) by mouth daily. 01/08/15   Brylie Sneath M Laylanie Kruczek, PA-C  ondansetron (ZOFRAN) 4 MG tablet Take 1 tablet (4 mg total) by mouth every 8 (eight) hours as needed for nausea. 12/09/12   Benjiman Core, MD  oxyCODONE-acetaminophen (PERCOCET) 5-325 MG per tablet Take 1 tablet by mouth every 4 (four) hours as needed for pain. 12/27/12   Jaynie Crumble, PA-C  PERCOCET 5-325 MG per tablet Take 1 tablet by mouth every 6 (six) hours as needed for pain. 12/14/12   Charlestine Night, PA-C  predniSONE (DELTASONE) 50 MG tablet Take 1 tablet (50 mg total) by mouth daily. 12/14/12   Charlestine Night, PA-C  sertraline (ZOLOFT) 100 MG tablet Take 100 mg by mouth daily.    Historical Provider, MD   BP 148/95 mmHg  Pulse 112  Temp(Src) 98.6 F (37 C)  Resp 16  SpO2 96% Physical Exam  Constitutional: He is oriented to person, place, and time. He appears well-developed and well-nourished. No distress.  HENT:  Head: Normocephalic and atraumatic.  Mouth/Throat: Oropharynx is clear and moist.  Eyes: Conjunctivae are normal.  Neck: Normal  range of motion. Neck supple. No spinous process tenderness and no muscular tenderness present.  Cardiovascular: Regular rhythm and normal heart sounds.   Mildly tachycardic.  Pulmonary/Chest: Effort normal and breath sounds normal. No respiratory distress.  Musculoskeletal: He exhibits no edema.  TTP left lumbar paraspinal muscles. Positive straight leg raise on left.  Neurological: He is alert and oriented to person, place, and time. He has normal strength.  Strength lower extremities 5/5 and equal bilateral. Sensation intact. Normal gait.  Skin: Skin is warm and dry. No rash noted. He is not diaphoretic.  Psychiatric: He has a normal mood and affect. His behavior is normal.  Nursing note and vitals reviewed.   ED Course  Procedures (including critical care time)  DIAGNOSTIC STUDIES: Oxygen Saturation is 96% on RA, normal by my interpretation.    COORDINATION OF CARE: 12:29 PM - Suspect sciatic nerve and muscle-related pain. Do not see need for XR imaging at this time. Discussed treatment plan with pt at bedside which includes pain medication, muscle relaxant, anti-inflammatory medication, and rest, ice, and heat. F/u with ortho as needed if no improvement in 4 days. Pt verbalized understanding and agreed to plan.   MDM   Final diagnoses:  Left-sided low back pain with left-sided sciatica   Nontoxic appearing, NAD. Mildly tachycardic. Vitals otherwise stable. No red flags concerning patient's back pain. No signs or symptoms of central cord compression or cauda equina. Exam consistent with left sciatica and left-sided muscular pain. No bony tenderness. Advised rest, ice/heat, NSAIDs and Valium. Follow-up with ortho in 5-7 days if no improvement. Stable for discharge. Return precautions given. Patient states understanding of treatment care plan and is agreeable.  I personally performed the services described in this documentation, which was scribed in my presence. The recorded  information has been reviewed and is accurate.  Kathrynn Speed, PA-C 01/08/15 1239  Toy Cookey, MD 01/09/15 [phone]

## 2015-01-17 ENCOUNTER — Other Ambulatory Visit (HOSPITAL_COMMUNITY): Payer: Self-pay | Admitting: Orthopaedic Surgery

## 2015-01-17 DIAGNOSIS — M545 Low back pain: Secondary | ICD-10-CM

## 2015-01-25 ENCOUNTER — Emergency Department (HOSPITAL_COMMUNITY)
Admission: EM | Admit: 2015-01-25 | Discharge: 2015-01-25 | Disposition: A | Discharge status: Home / Self Care | Payer: Self-pay | Attending: Emergency Medicine | Admitting: Emergency Medicine

## 2015-01-25 ENCOUNTER — Encounter (HOSPITAL_COMMUNITY): Payer: Self-pay | Admitting: Emergency Medicine

## 2015-01-25 DIAGNOSIS — Z7952 Long term (current) use of systemic steroids: Secondary | ICD-10-CM | POA: Insufficient documentation

## 2015-01-25 DIAGNOSIS — R6883 Chills (without fever): Secondary | ICD-10-CM | POA: Insufficient documentation

## 2015-01-25 DIAGNOSIS — Z791 Long term (current) use of non-steroidal anti-inflammatories (NSAID): Secondary | ICD-10-CM | POA: Insufficient documentation

## 2015-01-25 DIAGNOSIS — F319 Bipolar disorder, unspecified: Secondary | ICD-10-CM | POA: Insufficient documentation

## 2015-01-25 DIAGNOSIS — K029 Dental caries, unspecified: Secondary | ICD-10-CM | POA: Insufficient documentation

## 2015-01-25 DIAGNOSIS — T8584XA Pain due to internal prosthetic devices, implants and grafts, not elsewhere classified, initial encounter: Secondary | ICD-10-CM | POA: Insufficient documentation

## 2015-01-25 DIAGNOSIS — R61 Generalized hyperhidrosis: Secondary | ICD-10-CM | POA: Insufficient documentation

## 2015-01-25 DIAGNOSIS — Z79899 Other long term (current) drug therapy: Secondary | ICD-10-CM | POA: Insufficient documentation

## 2015-01-25 DIAGNOSIS — I10 Essential (primary) hypertension: Secondary | ICD-10-CM | POA: Insufficient documentation

## 2015-01-25 DIAGNOSIS — T85848A Pain due to other internal prosthetic devices, implants and grafts, initial encounter: Secondary | ICD-10-CM

## 2015-01-25 DIAGNOSIS — F4310 Post-traumatic stress disorder, unspecified: Secondary | ICD-10-CM | POA: Insufficient documentation

## 2015-01-25 DIAGNOSIS — Z720 Tobacco use: Secondary | ICD-10-CM | POA: Insufficient documentation

## 2015-01-25 DIAGNOSIS — Y831 Surgical operation with implant of artificial internal device as the cause of abnormal reaction of the patient, or of later complication, without mention of misadventure at the time of the procedure: Secondary | ICD-10-CM | POA: Insufficient documentation

## 2015-01-25 MED ORDER — TRAMADOL HCL 50 MG PO TABS: 50.0000 mg | ORAL_TABLET | Freq: Four times a day (QID) | ORAL | Status: DC | PRN

## 2015-01-25 NOTE — Discharge Instructions (Signed)
°Emergency Department Resource Guide °1) Find a Doctor and Pay Out of Pocket °Although you won't have to find out who is covered by your insurance plan, it is a good idea to ask around and get recommendations. You will then need to call the office and see if the doctor you have chosen will accept you as a new patient and what types of options they offer for patients who are self-pay. Some doctors offer discounts or will set up payment plans for their patients who do not have insurance, but you will need to ask so you aren't surprised when you get to your appointment. ° °2) Contact Your Local Health Department °Not all health departments have doctors that can see patients for sick visits, but many do, so it is worth a call to see if yours does. If you don't know where your local health department is, you can check in your phone book. The CDC also has a tool to help you locate your state's health department, and many state websites also have listings of all of their local health departments. ° °3) Find a Walk-in Clinic °If your illness is not likely to be very severe or complicated, you may want to try a walk in clinic. These are popping up all over the country in pharmacies, drugstores, and shopping centers. They're usually staffed by nurse practitioners or physician assistants that have been trained to treat common illnesses and complaints. They're usually fairly quick and inexpensive. However, if you have serious medical issues or chronic medical problems, these are probably not your best option. ° °No Primary Care Doctor: °- Call [facility] at  832-8000 - they can help you locate a primary care doctor that  accepts your insurance, provides certain services, etc. °- Physician Referral Service- 1-800-533-3463 ° °Chronic Pain Problems: °Organization         Address  Phone   Notes  °[facility]  (336) 297-2271 Patients need to be referred by their primary care doctor.  ° °Medication  Assistance: °Organization         Address  Phone   Notes  °[place] [facility] [address] °[place], [place] [address] (336) 641-8030 --Must be a resident of [place] °-- Must have NO insurance coverage whatsoever (no Medicaid/ Medicare, etc.) °-- The pt. MUST have a primary care doctor that directs their care regularly and follows them in the community °  °[facility]  (866) 331-1348   °[employer]  (888) 892-1162   ° °Agencies that provide inexpensive medical care: °Organization         Address  Phone   Notes  °[facility]  (336) 832-8035   °[facility]    (336) 832-7272   °[facility] [address] °[place],  [address] (336) 832-4777   °[facility] [address], °[place] (336) 271-4999   °[facility]    (336) 373-0678   °[facility]    (336) 272-1050   °[facility] ° [address], [place] Phone:  (336) 832-4444, Fax:  (336) 832-4440 Hours of Operation:  9 am - 6 pm, M-F.  Also accepts Medicaid/Medicare and self-pay.  °[facility] ° [address], [place] Phone: (336) 832-3150, Fax: (336) 832-3151. Hours of Operation:  8:30 am - 5:30 pm, M-F.  Also accepts Medicaid and self-pay.  °[facility] [place] [address], [place] Phone: (336) 878-6027   °[facility] [address], [place], [place] (336)723-1848, Ext. 123 Mondays & Thursdays: 7-9 AM.  First 15 patients are seen on a first come, first serve basis. °  ° °Medicaid-accepting [place] Providers: ° °Organization         Address  Phone   Notes  °[facility] [address], [place] (336) 641-2100 Also accepts self-pay patients.  °[facility] [address], [place] ° (336) 856-9996   °[facility] [address], [place]  (336) 288-8857   °[facility] [address] [place] [address], [place] (336) 299-7000   °Veita Bland [address], [place]  ° (336) 373-1557 Only accepts [place] Access Medicaid patients after they have their name applied to their card.  ° °Self-Pay (no insurance) in [place]: ° °Organization         Address  Phone   Notes  °Sickle Cell Patients, [facility] [address], [place] (336) 832-1970   °[facility] [address], [place] (336) 832-4400   °[facility] [place] ° [address], [place] (336) 992-4800   °[facility]/Dr. Osei-Bonsu ° [address] [place] [address], [place] or [address], [place] (336) 841-8500 Phone number for both [place] and [place] locations is the same.  °[facility] [address], [place] (336) 299-0000   °[facility] [address] [place] [address], [place] or [address] (336) 852-7530 °(336) 878-2260   °[facility] [address], [place] (336) 350-1642, phone; (336) 294-5005, fax Sees patients 1st and 3rd Saturday of every month.  Must not qualify for public or private insurance (i.e. Medicaid, Medicare, [place] Health Choice, Veterans' Benefits) • Household income should be no more than 200% of the poverty level •The clinic cannot treat you if you are pregnant or think you are pregnant • Sexually transmitted diseases are not treated at the clinic.  ° ° °Dental Care: °Organization         Address  Phone  Notes  °[place] [facility] [address], [place] (336) 641-6152 Accepts children up to age 21 who are enrolled in Medicaid or [place] Health Choice; pregnant women with a Medicaid card; and children who have applied for Medicaid or [place] Health Choice, but were declined, whose parents can pay a reduced fee at time of service.  °[place]  [facility] [place]  [address], [place] (336) 641-7733 Accepts children up to age 21 who are enrolled in Medicaid or [place] Health Choice; pregnant women with a Medicaid card; and children who have applied for Medicaid or  Health Choice, but were declined, whose parents can pay a reduced fee at time of service.  °[facility] ° [address], [place] (336) 641-4533 Patients are seen by appointment only. Walk-ins are not accepted. [facility] will see patients 18 years of age and older. °Monday - Tuesday (8am-5pm) °Most Wednesdays (8:30-5pm) °$30 per visit, cash only  °[facility] ° [address], [place] (336) 641-4533 Patients are seen by appointment only. Walk-ins are not accepted. [facility] will see patients 18 years of age and older. °One   Wednesday Evening (Monthly: Volunteer Based).  $30 per visit, cash only  °[facility]  (919) 537-3737 for adults; Children under age 4, call Graduate Pediatric Dentistry at (919) [phone]. Children aged 4-14, please call (919) [phone] to request a pediatric application. ° Dental services are provided in all areas of dental care including fillings, crowns and bridges, complete and partial dentures, implants, gum treatment, root canals, and extractions. Preventive care is also provided. Treatment is provided to both adults and children. °Patients are selected via a lottery and there is often a waiting list. °  °[facility] [address], °[place] ° (336) 763-8833 [email or web address] °  °[facility] [address], [place], [place] (336)723-1848, Ext. 123 Second and Fourth Thursday of each month, opens at 6:30 AM; Clinic ends at 9 AM.  Patients are seen on a first-come first-served basis, and a limited number are seen during each clinic.  ° °[facility] ° [address], [place], [place] (336) 723-7904    Eligibility Requirements °You must have lived in [place], [place], or [place] counties for at least the last three months. °  You cannot be eligible for state or federal sponsored healthcare insurance, including Veterans Administration, Medicaid, or Medicare. °  You generally cannot be eligible for healthcare insurance through your employer.  °  How to apply: °Eligibility screenings are held every Tuesday and Wednesday afternoon from 1:00 pm until 4:00 pm. You do not need an appointment for the interview!  °[facility] [address], [place], [place] 336-631-2330   °[facility] [facility]  336-342-8273   °[facility]  336-703-3100   °[facility]  336-570-6415   ° °Behavioral Health Resources in the Community: °Intensive Outpatient Programs °Organization         Address  Phone  Notes  °[facility] [address], [place], [place] 336-878-6098   °[facility] [address], [place], [place] 336-832-9800   °[facility] [address], [place], [place] ° 336-882-2125   °[facility] [address],  °[place], [place] 1-800-853-5163 or 336-641-4981   °Substance Abuse Resources °Organization         Address  Phone  Notes  °[facility]  336-882-2125   °[facility]  336-784-9470   °[facility]  336-285-9073   °[facility]  336-845-3988   °Residential & Outpatient Substance Abuse Program  1-800-659-3381   °Psychological Services °Organization         Address  Phone  Notes  °[facility]  336- 832-9600   °[facility]  336- 378-7881   °[facility] [address], [place] 1-800-853-5163 or 336-641-4981   ° °Mobile Crisis Teams °Organization         Address  Phone  Notes  °[facility], Mobile Crisis Care Unit  1-877-626-1772   °Assertive °[facility] ° [address]  [place], [place] 336-834-9664   °Sharon DeEsch [address] °[place] 336-554-5454   ° °Self-Help/Support Groups °Organization         Address  Phone             Notes  °[facility] - variety of support groups  336- 373-1402 Call for more information  °Narcotics Anonymous (NA), [facility] [address], °[place]   2 meetings at this location  ° °  Residential Treatment Programs °Organization         Address  Phone  Notes  °[facility] [address],    °[place]  1-866-801-8205   °[facility] ° [address], [place], [place] 704-293-8524   °[facility] [address], [place] 336-845-3988 Admissions: 8am-3pm M-F  °[facility] [address]    °[place], [place] 336-841-1104   °[facility] [address], [place], [place] 336-379-7146   °[facility] [address]  °[place], [place] 336-285-9073   °[facility] Intensive Outpatient [address], [place], [place] 336-852-3033   °[facility] [address]  °[place], [place] 1-877-615-2722 or 336-784-9470   °[facility] [address] [place], [place] 336-227-7417 Accepts Medicaid  °[facility] [address]  °[place] 1-800-659-3381 Substance Abuse/Addiction Treatment  ° °[place] Behavioral Health Resources °Organization         Address  Phone  Notes  °[facility]  (888) 581-9988   °Julie Brannon, PhD [address] [place], [place]   (336) 349-5553 or (336) 951-0000   °[facility]   [address] °[place], [place] (336) 349-4454   °[facility] [address], [place], [place] (336) 342-8316 Insurance/Medicaid/sponsorship through [facility]  °[facility] [address]                                    [place],  (336) 342-8316 Therapy/tele-psych/case    °[facility] [address]  ° [place],  (336) 349-2233    °Dr. Arfeen  (336) 349-4544   °[facility] [place]  [employer] [place] [facility] 1) [address], [place] °2) [address], [place] °3)  [address], [place] (336) 349-3220 °(336) 342-7768 ° °(336) 342-8140   °[place] [facility] (336) 342-1394 or (336) 342-3537 (After Hours)    ° ° °

## 2015-01-25 NOTE — ED Notes (Signed)
Pt called this RN to room to ask when a provider would be seeing him. This RN told the pt that she could not give an estimate. Pt stated that he wanted a referral or he would leave. This RN notified [place].

## 2015-01-25 NOTE — ED Provider Notes (Signed)
CSN: [id number]     Arrival date & time 01/25/15  4098 History   None    Chief Complaint  Patient presents with  . Dental Pain     (Consider location/radiation/quality/duration/timing/severity/associated sxs/prior Treatment) The history is provided by the patient.     Mr. Ernest Escobar is a 34 yo M that is p/w dental pain. Pain has been present for two months.  Initially it was intermittent but has been worsening as of late. The pain is sharp, 9/10 and occuring in his top left molar.  He was diagnosed with a cavity in December 2014 but never had it fixed.  Has been using oragel with some relief. It woke him up from his sleep today.  Having some chills and diaphoresis. Denies any fever, nausea, vomting, chest pain or SOB.    Past Medical History  Diagnosis Date  . PTSD (post-traumatic stress disorder)   . Bipolar 1 disorder   . Alcohol abuse   . Hypertension    Past Surgical History  Procedure Laterality Date  . Appendectomy    . Heel spur surgery    . Foot surgery     Family History  Problem Relation Age of Onset  . Heart disease Father   . High blood pressure Father   . Diabetes Father    History  Substance Use Topics  . Smoking status: Current Every Day Smoker -- 0.00 packs/day    Types: Cigarettes  . Smokeless tobacco: Never Used  . Alcohol Use: No    Review of Systems  Constitutional: Positive for chills and diaphoresis. Negative for fever.  HENT: Negative for drooling and trouble swallowing.   Respiratory: Negative for choking and chest tightness.   Cardiovascular: Negative for chest pain.  Gastrointestinal: Negative for nausea, vomiting, diarrhea and constipation.  Skin: Negative for rash.      Allergies  Review of patient's allergies indicates no known allergies.  Home Medications   Prior to Admission medications   Medication Sig Start Date End Date Taking? Authorizing Provider  cyclobenzaprine (FLEXERIL) 10 MG tablet Take 1 tablet (10 mg total) by mouth 3  (three) times daily as needed for muscle spasms. 12/14/12   Charlestine Night, PA-C  diazepam (VALIUM) 5 MG tablet Take 1 tablet (5 mg total) by mouth every 12 (twelve) hours as needed for muscle spasms. 01/08/15   Kathrynn Speed, PA-C  hydrOXYzine (ATARAX/VISTARIL) 25 MG tablet Take 25 mg by mouth 2 (two) times daily.    Historical Provider, MD  ibuprofen (ADVIL,MOTRIN) 800 MG tablet Take 800 mg by mouth every 8 (eight) hours as needed for pain.    Historical Provider, MD  ibuprofen (ADVIL,MOTRIN) 800 MG tablet Take 1 tablet (800 mg total) by mouth 3 (three) times daily. 12/27/12   Tatyana Kirichenko, PA-C  meloxicam (MOBIC) 15 MG tablet Take 1 tablet (15 mg total) by mouth daily. 01/08/15   Robyn M Hess, PA-C  ondansetron (ZOFRAN) 4 MG tablet Take 1 tablet (4 mg total) by mouth every 8 (eight) hours as needed for nausea. 12/09/12   Benjiman Core, MD  oxyCODONE-acetaminophen (PERCOCET) 5-325 MG per tablet Take 1 tablet by mouth every 4 (four) hours as needed for pain. 12/27/12   Tatyana Kirichenko, PA-C  PERCOCET 5-325 MG per tablet Take 1 tablet by mouth every 6 (six) hours as needed for pain. 12/14/12   Charlestine Night, PA-C  predniSONE (DELTASONE) 50 MG tablet Take 1 tablet (50 mg total) by mouth daily. 12/14/12   Charlestine Night, PA-C  sertraline (ZOLOFT) 100 MG tablet Take 100 mg by mouth daily.    Historical Provider, MD  traMADol (ULTRAM) 50 MG tablet Take 1 tablet (50 mg total) by mouth every 6 (six) hours as needed. 01/25/15   Myra RudeJeremy E Veneda Kirksey, MD   BP 144/94 mmHg  Pulse 84  Temp(Src) 97.6 F (36.4 C) (Oral)  Resp 16  Ht 5\' 9"  (1.753 m)  Wt 207 lb (93.895 kg)  BMI 30.55 kg/m2  SpO2 99% Physical Exam  Constitutional: He is oriented to person, place, and time. He appears well-developed and well-nourished.  HENT:  Head: Normocephalic and atraumatic.  Mouth/Throat: Uvula is midline, oropharynx is clear and moist and mucous membranes are normal.    Dental caries present  No abscess    Eyes: Conjunctivae and EOM are normal.  Neck: Normal range of motion.  Cardiovascular: Normal rate, regular rhythm, normal heart sounds and intact distal pulses.   No murmur heard. Pulmonary/Chest: Effort normal and breath sounds normal. No respiratory distress. He has no wheezes.  Abdominal: Soft. Bowel sounds are normal.  Musculoskeletal: Normal range of motion. He exhibits no edema.  Neurological: He is alert and oriented to person, place, and time.  Skin: Skin is warm. No rash noted.    ED Course  Procedures (including critical care time) Labs Review Labs Reviewed - No data to display  Imaging Review No results found.   EKG Interpretation None      MDM   Final diagnoses:  Dental implant pain, initial encounter    Mr. Ernest Escobar is p/w dental pain most likely 2/2 a dental caries.  Will proved tramadol #20 and given resources for dental providers. Patient agreeable with plan and discharge.   Myra RudeJeremy E Nakeia Calvi, MD PGY-2, [facility] 01/25/2015, 7:48 AM      Myra RudeJeremy E Spurgeon Gancarz, MD 01/25/15 [id number]  Nelva Nayobert Beaton, MD 01/25/15 [id number]

## 2015-01-25 NOTE — ED Notes (Signed)
Pt notified that a provider would see them as soon as they were available.

## 2015-01-25 NOTE — ED Notes (Signed)
Pt. reports left upper molar pain for 2 months worse this morning .

## 2015-01-25 NOTE — ED Notes (Signed)
Provider at the bedside with pt.

## 2015-01-27 ENCOUNTER — Ambulatory Visit (HOSPITAL_COMMUNITY): Payer: Self-pay

## 2015-02-02 ENCOUNTER — Ambulatory Visit (HOSPITAL_COMMUNITY)
Admission: RE | Admit: 2015-02-02 | Discharge: 2015-02-02 | Disposition: A | Discharge status: Home / Self Care | Payer: Self-pay | Source: Ambulatory Visit | Attending: Orthopaedic Surgery | Admitting: Orthopaedic Surgery

## 2015-02-02 DIAGNOSIS — M479 Spondylosis, unspecified: Secondary | ICD-10-CM | POA: Insufficient documentation

## 2015-02-02 DIAGNOSIS — M5127 Other intervertebral disc displacement, lumbosacral region: Secondary | ICD-10-CM | POA: Insufficient documentation

## 2015-02-02 DIAGNOSIS — M545 Low back pain: Secondary | ICD-10-CM

## 2015-02-28 ENCOUNTER — Encounter (HOSPITAL_COMMUNITY): Payer: Self-pay | Admitting: Nurse Practitioner

## 2015-02-28 ENCOUNTER — Emergency Department (HOSPITAL_COMMUNITY): Payer: Self-pay

## 2015-02-28 ENCOUNTER — Emergency Department (HOSPITAL_COMMUNITY)
Admission: EM | Admit: 2015-02-28 | Discharge: 2015-02-28 | Disposition: A | Discharge status: Home / Self Care | Payer: Self-pay | Attending: Emergency Medicine | Admitting: Emergency Medicine

## 2015-02-28 DIAGNOSIS — Y998 Other external cause status: Secondary | ICD-10-CM | POA: Insufficient documentation

## 2015-02-28 DIAGNOSIS — Z79899 Other long term (current) drug therapy: Secondary | ICD-10-CM | POA: Insufficient documentation

## 2015-02-28 DIAGNOSIS — F319 Bipolar disorder, unspecified: Secondary | ICD-10-CM | POA: Insufficient documentation

## 2015-02-28 DIAGNOSIS — S90122A Contusion of left lesser toe(s) without damage to nail, initial encounter: Secondary | ICD-10-CM

## 2015-02-28 DIAGNOSIS — I10 Essential (primary) hypertension: Secondary | ICD-10-CM | POA: Insufficient documentation

## 2015-02-28 DIAGNOSIS — Y9389 Activity, other specified: Secondary | ICD-10-CM | POA: Insufficient documentation

## 2015-02-28 DIAGNOSIS — Z791 Long term (current) use of non-steroidal anti-inflammatories (NSAID): Secondary | ICD-10-CM | POA: Insufficient documentation

## 2015-02-28 DIAGNOSIS — W228XXA Striking against or struck by other objects, initial encounter: Secondary | ICD-10-CM | POA: Insufficient documentation

## 2015-02-28 DIAGNOSIS — S90112A Contusion of left great toe without damage to nail, initial encounter: Secondary | ICD-10-CM | POA: Insufficient documentation

## 2015-02-28 DIAGNOSIS — Z720 Tobacco use: Secondary | ICD-10-CM | POA: Insufficient documentation

## 2015-02-28 DIAGNOSIS — Y9289 Other specified places as the place of occurrence of the external cause: Secondary | ICD-10-CM | POA: Insufficient documentation

## 2015-02-28 MED ORDER — KETOROLAC TROMETHAMINE 60 MG/2ML IM SOLN
60.0000 mg | Freq: Once | INTRAMUSCULAR | Status: AC
  Administered 2015-02-28: 60 mg via INTRAMUSCULAR
  Filled 2015-02-28: qty 2

## 2015-02-28 MED ORDER — NAPROXEN 500 MG PO TABS: 500.0000 mg | ORAL_TABLET | Freq: Two times a day (BID) | ORAL | Status: DC

## 2015-02-28 NOTE — ED Notes (Addendum)
Last week he was kicking a punching bag and hes had R big toe pain, swelling, and discoloration since. hes been taking ibuprofen with no relief

## 2015-02-28 NOTE — ED Provider Notes (Signed)
CSN: [id number]     Arrival date & time 02/28/15  1541 History  This chart was scribed for Eber Hong, MD by Budd Palmer, ED Scribe. This patient was seen in room TR06C/TR06C and the patient's care was started at 4:04 PM.     Chief Complaint  Patient presents with  . Toe Pain   The history is provided by the patient. No language interpreter was used.   HPI Comments: Ernest Escobar is a 34 y.o. male who presents to the Emergency Department complaining of acute, constant, aching R big toe pain gradual onset 8 days ago. Pt states he was kicking a bag and felt it pop. He reports associated deformity of the toe joint, swelling, and bruising. He notes exacerbation of the pain when walking. He has taken  ibuprofen for it with no relief. He has a PSHx of the R foot and is usually numb in it at baseline.   Past Medical History  Diagnosis Date  . PTSD (post-traumatic stress disorder)   . Bipolar 1 disorder   . Hypertension    Past Surgical History  Procedure Laterality Date  . Appendectomy    . Heel spur surgery    . Foot surgery     Family History  Problem Relation Age of Onset  . Heart disease Father   . High blood pressure Father   . Diabetes Father    History  Substance Use Topics  . Smoking status: Current Every Day Smoker -- 0.00 packs/day    Types: Cigarettes  . Smokeless tobacco: Never Used  . Alcohol Use: No    Review of Systems  Musculoskeletal: Positive for myalgias, joint swelling and arthralgias.  Neurological: Negative for numbness.    Allergies  Review of patient's allergies indicates no known allergies.  Home Medications   Prior to Admission medications   Medication Sig Start Date End Date Taking? Authorizing Provider  cyclobenzaprine (FLEXERIL) 10 MG tablet Take 1 tablet (10 mg total) by mouth 3 (three) times daily as needed for muscle spasms. 12/14/12   Charlestine Night, PA-C  diazepam (VALIUM) 5 MG tablet Take 1 tablet (5 mg total) by mouth  every 12 (twelve) hours as needed for muscle spasms. 01/08/15   Kathrynn Speed, PA-C  hydrOXYzine (ATARAX/VISTARIL) 25 MG tablet Take 25 mg by mouth 2 (two) times daily.    Historical Provider, MD  ibuprofen (ADVIL,MOTRIN) 800 MG tablet Take 800 mg by mouth every 8 (eight) hours as needed for pain.    Historical Provider, MD  ibuprofen (ADVIL,MOTRIN) 800 MG tablet Take 1 tablet (800 mg total) by mouth 3 (three) times daily. 12/27/12   Tatyana Kirichenko, PA-C  meloxicam (MOBIC) 15 MG tablet Take 1 tablet (15 mg total) by mouth daily. 01/08/15   Kathrynn Speed, PA-C  naproxen (NAPROSYN) 500 MG tablet Take 1 tablet (500 mg total) by mouth 2 (two) times daily with a meal. 02/28/15   Eber Hong, MD  ondansetron (ZOFRAN) 4 MG tablet Take 1 tablet (4 mg total) by mouth every 8 (eight) hours as needed for nausea. 12/09/12   Benjiman Core, MD  oxyCODONE-acetaminophen (PERCOCET) 5-325 MG per tablet Take 1 tablet by mouth every 4 (four) hours as needed for pain. 12/27/12   Tatyana Kirichenko, PA-C  PERCOCET 5-325 MG per tablet Take 1 tablet by mouth every 6 (six) hours as needed for pain. 12/14/12   Charlestine Night, PA-C  predniSONE (DELTASONE) 50 MG tablet Take 1 tablet (50 mg total) by mouth daily.  12/14/12   Charlestine Night, PA-C  sertraline (ZOLOFT) 100 MG tablet Take 100 mg by mouth daily.    Historical Provider, MD  traMADol (ULTRAM) 50 MG tablet Take 1 tablet (50 mg total) by mouth every 6 (six) hours as needed. 01/25/15   Myra Rude, MD   Ht 5\' 9"  (1.753 m)  Wt 205 lb 6.4 oz (93.169 kg)  BMI 30.32 kg/m2 Physical Exam  Constitutional: He appears well-developed and well-nourished.  HENT:  Head: Normocephalic and atraumatic.  Eyes: Conjunctivae are normal. Right eye exhibits no discharge. Left eye exhibits no discharge.  Pulmonary/Chest: Effort normal. No respiratory distress.  Musculoskeletal: He exhibits tenderness.  Tenderness over the right great toe MTP and IP joints with scant swelling and  asymmetry.   Neurological: He is alert. Coordination normal.  Skin: Skin is warm and dry. No rash noted. He is not diaphoretic. No erythema.  Psychiatric: He has a normal mood and affect.  Nursing note and vitals reviewed.   ED Course  Procedures  DIAGNOSTIC STUDIES:   COORDINATION OF CARE: 4:10 PM - Discussed plans to order an XR of the toe and a post-op shoe. Will order pain medication. Pt advised of plan for treatment and pt agrees.  Labs Review Labs Reviewed - No data to display  Imaging Review Dg Toe Great Right  02/28/2015   CLINICAL DATA:  Stubbed RIGHT great toe today kicking a punching bag, RIGHT great toe pain and swelling, initial encounter  EXAM: RIGHT GREAT TOE  COMPARISON:  RIGHT foot radiographs 01/24/2011  FINDINGS: Osseous mineralization normal.  Joint spaces preserved.  No definite acute fracture, dislocation, or bone destruction.  IMPRESSION: No acute osseous abnormalities.   Electronically Signed   By: Ulyses Southward M.D.   On: 02/28/2015 16:22      MDM   Final diagnoses:  Contusion of toe, left, initial encounter    The pt has normal xray - likely contusion - stable for d/c with ice, nsaids and post op shoe.  Meds given in ED:  Medications  ketorolac (TORADOL) injection 60 mg (60 mg Intramuscular Given 02/28/15 1633)    New Prescriptions   NAPROXEN (NAPROSYN) 500 MG TABLET    Take 1 tablet (500 mg total) by mouth 2 (two) times daily with a meal.      I personally performed the services described in this documentation, which was scribed in my presence. The recorded information has been reviewed and considered.   Eber Hong, MD 02/28/15 [phone]

## 2015-02-28 NOTE — Discharge Instructions (Signed)

## 2015-08-03 ENCOUNTER — Emergency Department (HOSPITAL_COMMUNITY): Payer: Self-pay

## 2015-08-03 ENCOUNTER — Emergency Department (HOSPITAL_COMMUNITY)
Admission: EM | Admit: 2015-08-03 | Discharge: 2015-08-03 | Disposition: A | Discharge status: Home / Self Care | Payer: Self-pay | Attending: Emergency Medicine | Admitting: Emergency Medicine

## 2015-08-03 ENCOUNTER — Encounter (HOSPITAL_COMMUNITY): Payer: Self-pay | Admitting: *Deleted

## 2015-08-03 DIAGNOSIS — I10 Essential (primary) hypertension: Secondary | ICD-10-CM | POA: Insufficient documentation

## 2015-08-03 DIAGNOSIS — R1011 Right upper quadrant pain: Secondary | ICD-10-CM | POA: Insufficient documentation

## 2015-08-03 DIAGNOSIS — F17210 Nicotine dependence, cigarettes, uncomplicated: Secondary | ICD-10-CM | POA: Insufficient documentation

## 2015-08-03 DIAGNOSIS — R079 Chest pain, unspecified: Secondary | ICD-10-CM | POA: Insufficient documentation

## 2015-08-03 DIAGNOSIS — Z8659 Personal history of other mental and behavioral disorders: Secondary | ICD-10-CM | POA: Insufficient documentation

## 2015-08-03 LAB — COMPREHENSIVE METABOLIC PANEL
ALBUMIN: 4.2 g/dL (ref 3.5–5.0)
ALK PHOS: 74 U/L (ref 38–126)
ALT: 40 U/L (ref 17–63)
ANION GAP: 12 (ref 5–15)
AST: 22 U/L (ref 15–41)
BILIRUBIN TOTAL: 0.6 mg/dL (ref 0.3–1.2)
BUN: 19 mg/dL (ref 6–20)
CALCIUM: 9.3 mg/dL (ref 8.9–10.3)
CO2: 27 mmol/L (ref 22–32)
Chloride: 101 mmol/L (ref 101–111)
Creatinine, Ser: 1.47 mg/dL — ABNORMAL HIGH (ref 0.61–1.24)
GFR calc Af Amer: >60 mL/min (ref >60)
GLUCOSE: 101 mg/dL — AB (ref 65–99)
Potassium: 4.1 mmol/L (ref 3.5–5.1)
Sodium: 140 mmol/L (ref 135–145)
TOTAL PROTEIN: 7.5 g/dL (ref 6.5–8.1)

## 2015-08-03 LAB — CBC
HCT: 49.5 % (ref 39.0–52.0)
HEMOGLOBIN: 17.3 g/dL — AB (ref 13.0–17.0)
MCH: 30 pg (ref 26.0–34.0)
MCHC: 34.9 g/dL (ref 30.0–36.0)
MCV: 85.8 fL (ref 78.0–100.0)
Platelets: 223 10*3/uL (ref 150–400)
RBC: 5.77 MIL/uL (ref 4.22–5.81)
RDW: 13.1 % (ref 11.5–15.5)
WBC: 26 10*3/uL — AB (ref 4.0–10.5)

## 2015-08-03 LAB — URINALYSIS, ROUTINE W REFLEX MICROSCOPIC
BILIRUBIN URINE: NEGATIVE
Glucose, UA: NEGATIVE mg/dL
Hgb urine dipstick: NEGATIVE
KETONES UR: NEGATIVE mg/dL
LEUKOCYTES UA: NEGATIVE
NITRITE: NEGATIVE
Protein, ur: NEGATIVE mg/dL
Specific Gravity, Urine: 1.03 (ref 1.005–1.030)
pH: 6 (ref 5.0–8.0)

## 2015-08-03 LAB — TROPONIN I

## 2015-08-03 LAB — LIPASE, BLOOD: Lipase: 28 U/L (ref 11–51)

## 2015-08-03 MED ORDER — ONDANSETRON HCL 4 MG/2ML IJ SOLN
4.0000 mg | Freq: Once | INTRAMUSCULAR | Status: AC
  Administered 2015-08-03: 4 mg via INTRAVENOUS
  Filled 2015-08-03: qty 2

## 2015-08-03 MED ORDER — MORPHINE SULFATE (PF) 4 MG/ML IV SOLN
4.0000 mg | Freq: Once | INTRAVENOUS | Status: AC
  Administered 2015-08-03: 4 mg via INTRAVENOUS
  Filled 2015-08-03: qty 1

## 2015-08-03 MED ORDER — GI COCKTAIL ~~LOC~~
30.0000 mL | Freq: Once | ORAL | Status: AC
  Administered 2015-08-03: 30 mL via ORAL
  Filled 2015-08-03: qty 30

## 2015-08-03 MED ORDER — OXYCODONE HCL 5 MG PO TABS: 5.0000 mg | ORAL_TABLET | ORAL | Status: DC | PRN

## 2015-08-03 MED ORDER — SODIUM CHLORIDE 0.9 % IV BOLUS (SEPSIS)
1000.0000 mL | Freq: Once | INTRAVENOUS | Status: AC
  Administered 2015-08-03: 1000 mL via INTRAVENOUS

## 2015-08-03 MED ORDER — ONDANSETRON 4 MG PO TBDP: 4.0000 mg | ORAL_TABLET | Freq: Three times a day (TID) | ORAL | Status: DC | PRN

## 2015-08-03 NOTE — Discharge Instructions (Signed)

## 2015-08-03 NOTE — ED Notes (Signed)
The pt started having mid chest pain or epigastric pain around 1900 after eating 2 hamburgers.  Some sob.  No previous history.  He still has hios gb

## 2015-08-03 NOTE — ED Provider Notes (Signed)
CSN: [id number]     Arrival date & time 08/03/15  0125 History   First MD Initiated Contact with Patient 08/03/15 [phone]     Chief Complaint  Patient presents with  . Chest Pain     (Consider location/radiation/quality/duration/timing/severity/associated sxs/prior Treatment) Patient is a 35 y.o. male presenting with chest pain and abdominal pain. The history is provided by the patient.  Chest Pain Associated symptoms: abdominal pain   Associated symptoms: no fever, no headache, no palpitations, no shortness of breath and not vomiting   Abdominal Pain Pain location:  Epigastric Pain quality: bloating and burning   Pain radiates to:  Does not radiate Pain severity:  Moderate Onset quality:  Sudden Duration:  8 hours Timing:  Constant Progression:  Unchanged Chronicity:  New Context: eating   Relieved by:  Nothing Worsened by:  Nothing tried Ineffective treatments:  None tried Associated symptoms: no chest pain, no chills, no diarrhea, no fever, no shortness of breath and no vomiting    35 yo M with a chief complaint of epigastric abdominal pain. This been going on for about 8 hours. This happened about an hour after having dinner. Patient has had some nausea denies vomiting. Denies fevers or chills. No relief after bowel movement. Tried some Gas-X without relief as well. Prior appendectomy. Denies injury. Pain feels like pressure. Pain is coming in waves. Denies radiation.  Past Medical History  Diagnosis Date  . PTSD (post-traumatic stress disorder)   . Bipolar 1 disorder (HCC)   . Hypertension    Past Surgical History  Procedure Laterality Date  . Appendectomy    . Heel spur surgery    . Foot surgery     Family History  Problem Relation Age of Onset  . Heart disease Father   . High blood pressure Father   . Diabetes Father    Social History  Substance Use Topics  . Smoking status: Current Every Day Smoker -- 0.00 packs/day    Types: Cigarettes  . Smokeless tobacco:  Never Used  . Alcohol Use: No    Review of Systems  Constitutional: Negative for fever and chills.  HENT: Negative for congestion and facial swelling.   Eyes: Negative for discharge and visual disturbance.  Respiratory: Negative for shortness of breath.   Cardiovascular: Negative for chest pain and palpitations.  Gastrointestinal: Positive for abdominal pain. Negative for vomiting and diarrhea.  Musculoskeletal: Negative for myalgias and arthralgias.  Skin: Negative for color change and rash.  Neurological: Negative for tremors, syncope and headaches.  Psychiatric/Behavioral: Negative for confusion and dysphoric mood.      Allergies  Review of patient's allergies indicates no known allergies.  Home Medications   Prior to Admission medications   Medication Sig Start Date End Date Taking? Authorizing Provider  cyclobenzaprine (FLEXERIL) 10 MG tablet Take 1 tablet (10 mg total) by mouth 3 (three) times daily as needed for muscle spasms. Patient not taking: Reported on 08/03/2015 12/14/12   Charlestine Nighthristopher Lawyer, PA-C  diazepam (VALIUM) 5 MG tablet Take 1 tablet (5 mg total) by mouth every 12 (twelve) hours as needed for muscle spasms. Patient not taking: Reported on 08/03/2015 01/08/15   Kathrynn Speedobyn M Hess, PA-C  ibuprofen (ADVIL,MOTRIN) 800 MG tablet Take 1 tablet (800 mg total) by mouth 3 (three) times daily. Patient not taking: Reported on 08/03/2015 12/27/12   Jaynie Crumbleatyana Kirichenko, PA-C  meloxicam (MOBIC) 15 MG tablet Take 1 tablet (15 mg total) by mouth daily. Patient not taking: Reported on 08/03/2015 01/08/15  Nada Boozer Hess, PA-C  naproxen (NAPROSYN) 500 MG tablet Take 1 tablet (500 mg total) by mouth 2 (two) times daily with a meal. Patient not taking: Reported on 08/03/2015 02/28/15   Eber Hong, MD  ondansetron (ZOFRAN ODT) 4 MG disintegrating tablet Take 1 tablet (4 mg total) by mouth every 8 (eight) hours as needed for nausea or vomiting. 08/03/15   Melene Plan, DO  ondansetron (ZOFRAN) 4 MG tablet  Take 1 tablet (4 mg total) by mouth every 8 (eight) hours as needed for nausea. Patient not taking: Reported on 08/03/2015 12/09/12   Benjiman Core, MD  oxyCODONE (ROXICODONE) 5 MG immediate release tablet Take 1 tablet (5 mg total) by mouth every 4 (four) hours as needed for severe pain. 08/03/15   Melene Plan, DO  oxyCODONE-acetaminophen (PERCOCET) 5-325 MG per tablet Take 1 tablet by mouth every 4 (four) hours as needed for pain. Patient not taking: Reported on 08/03/2015 12/27/12   Tatyana Kirichenko, PA-C  PERCOCET 5-325 MG per tablet Take 1 tablet by mouth every 6 (six) hours as needed for pain. Patient not taking: Reported on 08/03/2015 12/14/12   Charlestine Night, PA-C  predniSONE (DELTASONE) 50 MG tablet Take 1 tablet (50 mg total) by mouth daily. Patient not taking: Reported on 08/03/2015 12/14/12   Charlestine Night, PA-C  traMADol (ULTRAM) 50 MG tablet Take 1 tablet (50 mg total) by mouth every 6 (six) hours as needed. Patient not taking: Reported on 08/03/2015 01/25/15   Myra Rude, MD   BP 137/84 mmHg  Pulse 77  Temp(Src) 98.3 F (36.8 C) (Oral)  Resp 18  Ht 5\' 10"  (1.778 m)  Wt 221 lb (100.245 kg)  BMI 31.71 kg/m2  SpO2 97% Physical Exam  Constitutional: He is oriented to person, place, and time. He appears well-developed and well-nourished.  HENT:  Head: Normocephalic and atraumatic.  Eyes: EOM are normal. Pupils are equal, round, and reactive to light.  Neck: Normal range of motion. Neck supple. No JVD present.  Cardiovascular: Normal rate and regular rhythm.  Exam reveals no gallop and no friction rub.   No murmur heard. Pulmonary/Chest: No respiratory distress. He has no wheezes.  Abdominal: He exhibits no distension. There is tenderness (Tender palpation worst in the right upper quadrant.Positive Murphy sign.). There is no rebound and no guarding.  Musculoskeletal: Normal range of motion.  Neurological: He is alert and oriented to person, place, and time.  Skin: No rash  noted. No pallor.  Psychiatric: He has a normal mood and affect. His behavior is normal.  Nursing note and vitals reviewed.   ED Course  Procedures (including critical care time) Labs Review Labs Reviewed  COMPREHENSIVE METABOLIC PANEL - Abnormal; Notable for the following:    Glucose, Bld 101 (*)    Creatinine, Ser 1.47 (*)    All other components within normal limits  CBC - Abnormal; Notable for the following:    WBC 26.0 (*)    Hemoglobin 17.3 (*)    All other components within normal limits  URINALYSIS, ROUTINE W REFLEX MICROSCOPIC (NOT AT [facility]) - Abnormal; Notable for the following:    Color, Urine AMBER (*)    All other components within normal limits  LIPASE, BLOOD  TROPONIN I    Imaging Review Dg Chest 2 View  08/03/2015  CLINICAL DATA:  Mid chest pain, onset last night. EXAM: CHEST  2 VIEW COMPARISON:  05/08/2012 FINDINGS: Lung volumes are low leading to crowding of bronchovascular structures. Minimal bibasilar atelectasis. No  consolidation. Heart size and mediastinal contours are normal for technique. No pulmonary edema, pleural effusion or pneumothorax. No acute osseous abnormalities. IMPRESSION: Hypoventilatory chest with bibasilar atelectasis. Electronically Signed   By: Rubye Oaks M.D.   On: 08/03/2015 04:01   US Abdomen Limited Ruq  08/03/2015  CLINICAL DATA:  Right upper quadrant pain EXAM: US ABDOMEN LIMITED - RIGHT UPPER QUADRANT COMPARISON:  CT 12/27/2012 FINDINGS: Gallbladder: No gallstones or wall thickening visualized. No sonographic Murphy sign noted by sonographer. Common bile duct: Partial visualization.  Maximal Diameter: 4 mm Liver: No focal lesion identified. Within normal limits in parenchymal echogenicity. Antegrade flow in the imaged portal venous system IMPRESSION: Normal right upper quadrant ultrasound. Electronically Signed   By: Marnee Spring M.D.   On: 08/03/2015 07:13   I have personally reviewed and evaluated these images and lab results as  part of my medical decision-making.   EKG Interpretation None      MDM   Final diagnoses:  RUQ pain    35 yo M with a chief complaint of epigastric abdominal pain. Pain is worse in the right upper quadrant on exam with a positive Murphy sign. Patient with a marked leukocytosis as well as an AKI. Right upper quadrant ultrasound is negative. Patient feeling much better after GI cocktail morphine fluids and Zofran. Repeat exam benign. Will discharge the patient home. Have him take Zantac for possible reflux.  7:42 AM:  I have discussed the diagnosis/risks/treatment options with the patient and family and believe the pt to be eligible for discharge home to follow-up with PCP. We also discussed returning to the ED immediately if new or worsening sx occur. We discussed the sx which are most concerning (e.g., sudden worsening pain, fever, inability to tolerate by mouth) that necessitate immediate return. Medications administered to the patient during their visit and any new prescriptions provided to the patient are listed below.  Medications given during this visit Medications  sodium chloride 0.9 % bolus 1,000 mL (0 mLs Intravenous Stopped 08/03/15 0543)  gi cocktail (Maalox,Lidocaine,Donnatal) (30 mLs Oral Given 08/03/15 0452)  morphine 4 MG/ML injection 4 mg (4 mg Intravenous Given 08/03/15 0543)  ondansetron (ZOFRAN) injection 4 mg (4 mg Intravenous Given 08/03/15 0543)    Discharge Medication List as of 08/03/2015  7:21 AM    START taking these medications   Details  ondansetron (ZOFRAN ODT) 4 MG disintegrating tablet Take 1 tablet (4 mg total) by mouth every 8 (eight) hours as needed for nausea or vomiting., Starting 08/03/2015, Until Discontinued, Print    oxyCODONE (ROXICODONE) 5 MG immediate release tablet Take 1 tablet (5 mg total) by mouth every 4 (four) hours as needed for severe pain., Starting 08/03/2015, Until Discontinued, Print        The patient appears reasonably screen and/or  stabilized for discharge and I doubt any other medical condition or other [facility] requiring further screening, evaluation, or treatment in the ED at this time prior to discharge.      Melene Plan, DO 08/03/15 [phone]

## 2015-08-05 ENCOUNTER — Telehealth: Payer: Self-pay | Admitting: *Deleted

## 2015-08-05 NOTE — Telephone Encounter (Signed)
Pt called regarding nephrology referral he thought he received on his last visit.  NCM reviewed chart, noticed pt does not have PCP or insurance and that there was no nephrology referral made.  NCM suggested pt call [facility] to obtain appointment for PCP establishment.  Pt verbalized understanding.

## 2015-08-22 ENCOUNTER — Encounter: Payer: Self-pay | Admitting: Family Medicine

## 2015-08-22 ENCOUNTER — Ambulatory Visit (INDEPENDENT_AMBULATORY_CARE_PROVIDER_SITE_OTHER): Payer: Self-pay | Admitting: Family Medicine

## 2015-08-22 VITALS — BP 133/74 | HR 93 | Temp 98.0°F | Ht 69.0 in | Wt 223.0 lb

## 2015-08-22 DIAGNOSIS — R319 Hematuria, unspecified: Secondary | ICD-10-CM

## 2015-08-22 DIAGNOSIS — R748 Abnormal levels of other serum enzymes: Secondary | ICD-10-CM

## 2015-08-22 DIAGNOSIS — M546 Pain in thoracic spine: Secondary | ICD-10-CM

## 2015-08-22 DIAGNOSIS — R7989 Other specified abnormal findings of blood chemistry: Secondary | ICD-10-CM

## 2015-08-22 DIAGNOSIS — D72829 Elevated white blood cell count, unspecified: Secondary | ICD-10-CM

## 2015-08-22 DIAGNOSIS — Z7189 Other specified counseling: Secondary | ICD-10-CM

## 2015-08-22 DIAGNOSIS — R232 Flushing: Secondary | ICD-10-CM

## 2015-08-22 DIAGNOSIS — Z7689 Persons encountering health services in other specified circumstances: Secondary | ICD-10-CM

## 2015-08-22 DIAGNOSIS — K219 Gastro-esophageal reflux disease without esophagitis: Secondary | ICD-10-CM

## 2015-08-22 DIAGNOSIS — Z0000 Encounter for general adult medical examination without abnormal findings: Secondary | ICD-10-CM

## 2015-08-22 LAB — POCT URINALYSIS DIP (DEVICE)
BILIRUBIN URINE: NEGATIVE
Glucose, UA: NEGATIVE mg/dL
HGB URINE DIPSTICK: NEGATIVE
KETONES UR: NEGATIVE mg/dL
Leukocytes, UA: NEGATIVE
Nitrite: NEGATIVE
PH: 6 (ref 5.0–8.0)
PROTEIN: NEGATIVE mg/dL
Specific Gravity, Urine: >=1.03 (ref 1.005–1.030)
Urobilinogen, UA: 0.2 mg/dL (ref 0.0–1.0)

## 2015-08-22 LAB — COMPLETE METABOLIC PANEL WITH GFR
ALBUMIN: 4.4 g/dL (ref 3.6–5.1)
ALK PHOS: 71 U/L (ref 40–115)
ALT: 30 U/L (ref 9–46)
AST: 16 U/L (ref 10–40)
BUN: 17 mg/dL (ref 7–25)
CO2: 26 mmol/L (ref 20–31)
Calcium: 9.3 mg/dL (ref 8.6–10.3)
Chloride: 104 mmol/L (ref 98–110)
Creat: 1.03 mg/dL (ref 0.60–1.35)
GFR, Est African American: >89 mL/min (ref >=60)
GFR, Est Non African American: >89 mL/min (ref >=60)
GLUCOSE: 92 mg/dL (ref 65–99)
POTASSIUM: 4.2 mmol/L (ref 3.5–5.3)
SODIUM: 139 mmol/L (ref 135–146)
TOTAL PROTEIN: 6.9 g/dL (ref 6.1–8.1)
Total Bilirubin: 0.3 mg/dL (ref 0.2–1.2)

## 2015-08-22 MED ORDER — PRAVASTATIN SODIUM 40 MG PO TABS
40.0000 mg | ORAL_TABLET | Freq: Every day | ORAL | Status: DC
Start: 2015-08-22 — End: 2015-08-22

## 2015-08-22 NOTE — Progress Notes (Addendum)
Patient ID: Ernest Escobar, male   DOB: Apr 25, 1981, 35 y.o.   MRN: [id number]   Ernest Escobar, is a 35 y.o. male  HKV:[id number]  FIE:[id number]  DOB - 1981-05-29  CC:  Chief Complaint  Patient presents with  . new patient/get established    went to ER and is here for follow up and to get rx for pain and evaluation of abnormal findings at ER visit, main complaints are abd pain radiating to back, leaking of urine, decreased appetite,        HPI: Ernest Escobar is a 35 y.o. male here to establish care. He was seen in ED on 08/03/15 for RUQ pain. No particular reason was found for the pain. A review of labs shows that at that time he had a WBC of 26,000. His GFR was >60 and BUN was normal but creat. Was 1.47. He was referred here for follow-up and further work-up. He mid back pain at the same level as the abdominal pain he experiences.  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 He also reports urinary hesitancy, leakage and hematuria. He reports his stools are black. His HBG on the 4th was 17+. He also reports fatigue and decreased appetite. He reports a 20 # weight loss since May 2016. He has been seen by Dr. Cleophas Dunker for back issues and request referral back there. He is awaiting a [employer]. His only medication at this time is tylenol. He reports that Tramadol does not help his pain. I have explained we do no prescribe other narcotics. Patient declined influenza.Patient admits to smoking 1/2 pack cigarettes daily. Is not ready to quit. He also reports using marijuana night to help relax to sleep. No Known Allergies Past Medical History  Diagnosis Date  . PTSD (post-traumatic stress disorder)   . Bipolar 1 disorder (HCC)   . Hypertension    No current outpatient prescriptions on file prior to visit.   No current facility-administered medications on file prior to visit.   Family History  Problem Relation Age of Onset  . Heart  disease Father   . High blood pressure Father   . Diabetes Father    Social History   Social History  . Marital Status: Single    Spouse Name: N/A  . Number of Children: N/A  . Years of Education: N/A   Occupational History  . Not on file.   Social History Main Topics  . Smoking status: Current Every Day Smoker -- 0.00 packs/day    Types: Cigarettes  . Smokeless tobacco: Never Used  . Alcohol Use: No  . Drug Use: Yes    Special: Marijuana  . Sexual Activity: Not on file   Other Topics Concern  . Not on file   Social History Narrative    Review of Systems: Constitutional: Negative for fever, chills. Positive for appetite change, weight loss, hot flashes with sweating and fatigue Skin: Negative for rashes or lesions of concern. HENT: Negative for ear pain, ear discharge.nose bleeds Eyes: Negative for pain, discharge, redness, itching and visual disturbance. Neck: Negative for pain, stiffness Respiratory: Positive for cough, (smoker) and shortness of breath with exertion Cardiovascular: Negative for chest pain, palpitations and leg swelling. Gastrointestinal: Positive  for abdominal pain, nausea, vomiting, diarrhea, constipations. Positive for black stools Genitourinary: Negative for dysuria, urgency, frequency. Positive for hesitancy, leakage, blood Musculoskeletal: Positive  for back pain, Negative for other  joint pain, joint  swelling,  and gait problem.Negative for weakness. Neurological: Negative for dizziness, tremors, seizures, syncope,   light-headedness, numbness and headaches.  Hematological: Negative for easy bruising or bleeding Psychiatric/Behavioral: Negative for depression, anxiety, decreased concentration, confusion   Objective:   Filed Vitals:   08/22/15 0819  BP: 133/74  Pulse: 93  Temp: 98 F (36.7 C)    Physical Exam: Constitutional: Patient appears well-developed and well-nourished. No distress. HENT: Normocephalic, atraumatic, External  right and left ear normal. Oropharynx is clear and moist.  Eyes: Conjunctivae and EOM are normal. PERRLA, no scleral icterus. Neck: Normal ROM. Neck supple. No lymphadenopathy, No thyromegaly. CVS: RRR, S1/S2 +, no murmurs, no gallops, no rubs Pulmonary: Effort and breath sounds normal, no stridor, rhonchi, wheezes, rales.  Abdominal: Soft. Normoactive BS,, no distension, tenderness, rebound or guarding.  Musculoskeletal: Normal range of motion. No edema and no tenderness. There is mild tenderness to deep palpation and tap to the right kidney area.  Neuro: Alert.Normal muscle tone coordination. Non-focal Skin: Skin is warm and dry. No rash noted. Not diaphoretic. No erythema. No pallor. Psychiatric: Normal mood and affect. Behavior, judgment, thought content normal.  Lab Results  Component Value Date   WBC 26.0* 08/03/2015   HGB 17.3* 08/03/2015   HCT 49.5 08/03/2015   MCV 85.8 08/03/2015   PLT 223 08/03/2015   Lab Results  Component Value Date   CREATININE 1.47* 08/03/2015   BUN 19 08/03/2015   NA 140 08/03/2015   K 4.1 08/03/2015   CL 101 08/03/2015   CO2 27 08/03/2015    No results found for: HGBA1C Lipid Panel  No results found for: CHOL, TRIG, HDL, CHOLHDL, VLDL, LDLCALC     Assessment and plan:   1. Health care maintenance - [place] Stool Card-TAKE HOME; Future -Patient declined influenza -States Tdap with 5 years.  2. Right-sided thoracic back pain - Ambulatory referral to Orthopedic Surgery  3. Encounter to establish care -I have reviewed informtion presented by the patient and pertinent information from his chart.   4. Elevated WBC count  CBC w/Diff  5. Elevated serum creatinine - COMPLETE METABOLIC PANEL WITH GFR  6 Gastroesophageal reflux disease, esophagitis presence not specified - Pravastatin 40 mg. #90, one po q day.  7. Urinary Incontenience. - POCT Urinalysis Dipstick -Encourage to let me know as soon as [employer] in place, so I can  refer to urology  8. Hot flashes - TSH    9. Tobacco abuse - Have advise to quit/ Provided info on [facility].  10. Marijuana Use - Have advised to quit.  No Follow-up on file.  The patient was given clear instructions to go to ER or return to medical center if symptoms don't improve, worsen or new problems develop. The patient verbalized understanding.    Henrietta Hoover FNP  08/22/2015, 9:15 AM

## 2015-08-23 LAB — CBC WITH DIFFERENTIAL/PLATELET
BASOS PCT: 0 % (ref 0–1)
Basophils Absolute: 0 10*3/uL (ref 0.0–0.1)
Eosinophils Absolute: 0 10*3/uL (ref 0.0–0.7)
Eosinophils Relative: 0 % (ref 0–5)
HEMATOCRIT: 50.5 % (ref 39.0–52.0)
HEMOGLOBIN: 17.2 g/dL — AB (ref 13.0–17.0)
Lymphocytes Relative: 61 % — ABNORMAL HIGH (ref 12–46)
Lymphs Abs: 10.1 10*3/uL — ABNORMAL HIGH (ref 0.7–4.0)
MCH: 29.8 pg (ref 26.0–34.0)
MCHC: 34.1 g/dL (ref 30.0–36.0)
MCV: 87.5 fL (ref 78.0–100.0)
MONO ABS: 1 10*3/uL (ref 0.1–1.0)
MONOS PCT: 6 % (ref 3–12)
MPV: 10.7 fL (ref 8.6–12.4)
NEUTROS ABS: 5.5 10*3/uL (ref 1.7–7.7)
Neutrophils Relative %: 33 % — ABNORMAL LOW (ref 43–77)
Platelets: 198 10*3/uL (ref 150–400)
RBC: 5.77 MIL/uL (ref 4.22–5.81)
RDW: 13.9 % (ref 11.5–15.5)
WBC: 16.6 10*3/uL — ABNORMAL HIGH (ref 4.0–10.5)

## 2015-08-23 LAB — TSH: TSH: 1.53 u[IU]/mL (ref 0.350–4.500)

## 2015-08-23 LAB — PATHOLOGIST SMEAR REVIEW

## 2015-08-27 ENCOUNTER — Emergency Department (HOSPITAL_COMMUNITY): Payer: Self-pay

## 2015-08-27 ENCOUNTER — Emergency Department (HOSPITAL_COMMUNITY)
Admission: EM | Admit: 2015-08-27 | Discharge: 2015-08-27 | Disposition: A | Discharge status: Home / Self Care | Payer: Self-pay | Attending: Emergency Medicine | Admitting: Emergency Medicine

## 2015-08-27 ENCOUNTER — Encounter (HOSPITAL_COMMUNITY): Payer: Self-pay | Admitting: *Deleted

## 2015-08-27 DIAGNOSIS — Z8659 Personal history of other mental and behavioral disorders: Secondary | ICD-10-CM | POA: Insufficient documentation

## 2015-08-27 DIAGNOSIS — M545 Low back pain, unspecified: Secondary | ICD-10-CM

## 2015-08-27 DIAGNOSIS — I10 Essential (primary) hypertension: Secondary | ICD-10-CM | POA: Insufficient documentation

## 2015-08-27 DIAGNOSIS — F17210 Nicotine dependence, cigarettes, uncomplicated: Secondary | ICD-10-CM | POA: Insufficient documentation

## 2015-08-27 DIAGNOSIS — R200 Anesthesia of skin: Secondary | ICD-10-CM | POA: Insufficient documentation

## 2015-08-27 MED ORDER — HYDROMORPHONE HCL 1 MG/ML IJ SOLN
1.0000 mg | Freq: Once | INTRAMUSCULAR | Status: AC
  Administered 2015-08-27: 1 mg via INTRAVENOUS
  Filled 2015-08-27: qty 1

## 2015-08-27 MED ORDER — DIAZEPAM 5 MG/ML IJ SOLN
5.0000 mg | Freq: Once | INTRAMUSCULAR | Status: AC
  Administered 2015-08-27: 5 mg via INTRAVENOUS
  Filled 2015-08-27: qty 2

## 2015-08-27 MED ORDER — DEXAMETHASONE SODIUM PHOSPHATE 10 MG/ML IJ SOLN
10.0000 mg | Freq: Once | INTRAMUSCULAR | Status: AC
  Administered 2015-08-27: 10 mg via INTRAVENOUS
  Filled 2015-08-27: qty 1

## 2015-08-27 MED ORDER — OXYCODONE-ACETAMINOPHEN 5-325 MG PO TABS: 1.0000 | ORAL_TABLET | ORAL | Status: AC | PRN

## 2015-08-27 MED ORDER — KETOROLAC TROMETHAMINE 30 MG/ML IJ SOLN
30.0000 mg | Freq: Once | INTRAMUSCULAR | Status: AC
  Administered 2015-08-27: 30 mg via INTRAVENOUS
  Filled 2015-08-27: qty 1

## 2015-08-27 NOTE — Discharge Instructions (Signed)
Back Pain, Adult °Back pain is very common in adults. The cause of back pain is rarely dangerous and the pain often gets better over time. The cause of your back pain may not be known. Some common causes of back pain include: °· Strain of the muscles or ligaments supporting the spine. °· Wear and tear (degeneration) of the spinal disks. °· Arthritis. °· Direct injury to the back. °For many people, back pain may return. Since back pain is rarely dangerous, most people can learn to manage this condition on their own. °HOME CARE INSTRUCTIONS °Watch your back pain for any changes. The following actions may help to lessen any discomfort you are feeling: °· Remain active. It is stressful on your back to sit or stand in one place for long periods of time. Do not sit, drive, or stand in one place for more than 30 minutes at a time. Take short walks on even surfaces as soon as you are able. Try to increase the length of time you walk each day. °· Exercise regularly as directed by your health care provider. Exercise helps your back heal faster. It also helps avoid future injury by keeping your muscles strong and flexible. °· Do not stay in bed. Resting more than 1-2 days can delay your recovery. °· Pay attention to your body when you bend and lift. The most comfortable positions are those that put less stress on your recovering back. Always use proper lifting techniques, including: °¨ Bending your knees. °¨ Keeping the load close to your body. °¨ Avoiding twisting. °· Find a comfortable position to sleep. Use a firm mattress and lie on your side with your knees slightly bent. If you lie on your back, put a pillow under your knees. °· Avoid feeling anxious or stressed. Stress increases muscle tension and can worsen back pain. It is important to recognize when you are anxious or stressed and learn ways to manage it, such as with exercise. °· Take medicines only as directed by your health care provider. Over-the-counter  medicines to reduce pain and inflammation are often the most helpful. Your health care provider may prescribe muscle relaxant drugs. These medicines help dull your pain so you can more quickly return to your normal activities and healthy exercise. °· Apply ice to the injured area: °¨ Put ice in a plastic bag. °¨ Place a towel between your skin and the bag. °¨ Leave the ice on for 20 minutes, 2-3 times a day for the first 2-3 days. After that, ice and heat may be alternated to reduce pain and spasms. °· Maintain a healthy weight. Excess weight puts extra stress on your back and makes it difficult to maintain good posture. °SEEK MEDICAL CARE IF: °· You have pain that is not relieved with rest or medicine. °· You have increasing pain going down into the legs or buttocks. °· You have pain that does not improve in one week. °· You have night pain. °· You lose weight. °· You have a fever or chills. °SEEK IMMEDIATE MEDICAL CARE IF:  °· You develop new bowel or bladder control problems. °· You have unusual weakness or numbness in your arms or legs. °· You develop nausea or vomiting. °· You develop abdominal pain. °· You feel faint. °  °This information is not intended to replace advice given to you by your health care provider. Make sure you discuss any questions you have with your health care provider. °  °Document Released: 07/16/2005 Document Revised: 08/06/2014 Document Reviewed: 11/17/2013 °Elsevier Interactive Patient Education ©2016 [employer] Herniated Disk  A herniated disk occurs when a disk in your spine bulges out too far. Your spine (backbone) is made up of bones called vertebrae. A disk with a spongy center is located between each pair of bones. These disks act as shock absorbers when you move. A herniated disk can cause pain and muscle weakness.   HOME CARE  Take all medicines as told by your doctor.  Rest for 2 days and then start moving.  Do not sit or stand for long periods of time.  Maintain  good posture when sitting and standing.  Avoid moving in a way that causes pain, such as bending or lifting. When you are able to start lifting things again:  Blackey with your knees.  Keep your back straight.  Hold heavy objects close to your body. If you are overweight, ask your doctor about starting a weight-loss program.  When you are able to start exercising, ask your doctor how much and what type of exercise is best for you.  Work with a physical therapist on stretching and strengthening exercises for your back.  Do not wear high-heeled shoes.  Do not sleep on your belly.  Do not smoke.  Keep all follow-up visits as told by your doctor. GET HELP IF:  You have back or neck pain that is not getting better after 4 weeks.  You have very bad pain in your back or neck.  You have a loss of feeling (numbness), tingling, or weakness along with pain. GET HELP RIGHT AWAY IF:  You have tingling, weakness, or loss of feeling that makes you unable to use your arms or legs.  You are not able to control when you pee (urinate) or poop (bowel movement).  You have dizziness or fainting.  You have shortness of breath. MAKE SURE YOU:  Understand these instructions.  Will watch your condition.  Will get help right away if you are not doing well or get worse. This information is not intended to replace advice given to you by your health care provider. Make sure you discuss any questions you have with your health care provider.  Document Released: 11/30/2013 Document Reviewed: 11/30/2013  Elsevier Interactive Patient Education [employer]

## 2015-08-27 NOTE — ED Provider Notes (Signed)
CSN: [id number]     Arrival date & time 08/27/15  8295 History  By signing my name below, I, Phillis Haggis, attest that this documentation has been prepared under the direction and in the presence of Cheri Fowler, PA-C. Electronically Signed: Phillis Haggis, ED Scribe. 08/27/2015. 12:03 PM.   Chief Complaint  Patient presents with  . Back Pain   The history is provided by the patient. No language interpreter was used.  HPI Comments: Ernest Escobar is a [name] with a hx of HTN and L3-S1 chronic back pain who presents to the Emergency Department complaining of constant, burning, left sided lower back pain onset one week ago, that became severely worse this AM. Pt states that he woke up with worsening burning pain in his back with shooting numbness in his toes. He reports worsening pain with walking. He states that he has been palpating the area and there is decreased sensation: "I can feel pressure but not pain." Pt has been evaluated in the ED for the same and has been referred to orthopedics by his PCP. He has had an MRI in July, but states his symptoms have worsened since then. Pt states that he has been evaluated by an orthopedist at [facility], but due to lack of insurance has not been able to follow up for treatment. Pt took Ultram at work last night to no relief. He states that it is difficult for him to perform duties at work due to pain. Pt denies active hx of cancer, IV drug use, fever, chills, nausea, vomiting, abdominal pain, bladder or bowel incontinence, or weakness.   Past Medical History  Diagnosis Date  . PTSD (post-traumatic stress disorder)   . Bipolar 1 disorder (HCC)   . Hypertension    Past Surgical History  Procedure Laterality Date  . Appendectomy    . Heel spur surgery    . Foot surgery     Family History  Problem Relation Age of Onset  . Heart disease Father   . High blood pressure Father   . Diabetes Father    Social History  Substance Use Topics  .  Smoking status: Current Every Day Smoker -- 0.00 packs/day    Types: Cigarettes  . Smokeless tobacco: Never Used  . Alcohol Use: No    Review of Systems  Constitutional: Negative for fever and chills.  Gastrointestinal: Negative for nausea, vomiting and abdominal pain.  Musculoskeletal: Positive for back pain.  Neurological: Positive for numbness. Negative for weakness.  All other systems reviewed and are negative.  Allergies  Review of patient's allergies indicates no known allergies.  Home Medications   Prior to Admission medications   Medication Sig Start Date End Date Taking? Authorizing Provider  acetaminophen (TYLENOL) 325 MG tablet Take 325 mg by mouth every 6 (six) hours as needed.    Historical Provider, MD  oxyCODONE-acetaminophen (PERCOCET/ROXICET) 5-325 MG tablet Take 1 tablet by mouth every 4 (four) hours as needed for severe pain. 08/27/15   Dalores Weger, PA-C   BP 126/63 mmHg  Pulse 67  Temp(Src) 97.6 F (36.4 C) (Oral)  Resp 18  SpO2 100% Physical Exam  Constitutional: He is oriented to person, place, and time. He appears well-developed and well-nourished.  Non-toxic appearance. He does not have a sickly appearance. He does not appear ill.  HENT:  Head: Normocephalic and atraumatic.  Mouth/Throat: Oropharynx is clear and moist.  Eyes: Conjunctivae are normal. Pupils are equal, round, and reactive to light.  Neck:  Normal range of motion. Neck supple.  Cardiovascular: Normal rate, regular rhythm, normal heart sounds and intact distal pulses.   No murmur heard. Pulses:      Dorsalis pedis pulses are 2+ on the right side, and 2+ on the left side.  Pulmonary/Chest: Effort normal and breath sounds normal. No accessory muscle usage or stridor. No respiratory distress. He has no wheezes. He has no rhonchi. He has no rales.  Abdominal: Soft. Bowel sounds are normal. He exhibits no distension. There is no tenderness.  Musculoskeletal: Normal range of motion. He exhibits  tenderness.  No lumbar midline tenderness. Tender to palpation of left lumbar musculature. No crepitus or step-offs.  Lymphadenopathy:    He has no cervical adenopathy.  Neurological: He is alert and oriented to person, place, and time.  Speech clear without dysarthria. Antalgic gait favoring the left side. Strength intact bilaterally throughout lower extremities; however, decreased on the left side secondary to pain. Saddle anesthesia. Altered sensation in the fourth digit of the left lower extremity consistent with S1 dermatome; otherwise, sensation normal.  Skin: Skin is warm and dry.  Psychiatric: He has a normal mood and affect. His behavior is normal.    ED Course  Procedures (including critical care time) DIAGNOSTIC STUDIES: Oxygen Saturation is 100% on RA, normal by my interpretation.    COORDINATION OF CARE: 10:06 AM-Discussed treatment plan which includes MRI with pt at bedside and pt agreed to plan.    Labs Review Labs Reviewed - No data to display  Imaging Review Mr Lumbar Spine Wo Contrast  08/27/2015  CLINICAL DATA:  Acute presentation with numbness in the toes in chronic low back pain. Symptoms on the left. EXAM: MRI LUMBAR SPINE WITHOUT CONTRAST TECHNIQUE: Multiplanar, multisequence MR imaging of the lumbar spine was performed. No intravenous contrast was administered. COMPARISON:  02/02/2015 FINDINGS: There is no abnormality at L2-3 or above. The discs are unremarkable. The canal and foramina are widely patent. The distal cord and conus are normal with conus tip at lower L1. L3-4: Mild desiccation and bulging of the disc but no stenosis or neural compression. L4-5:  Normal interspace. L5-S1: Desiccation and mild bulging of the disc. No stenosis or neural compression. Minimal facet degeneration. Previously seen disc protrusion at this level has involuted. IMPRESSION: No worsening or definite cause of the presenting symptoms is identified. Mild non-compressive disc bulge at  L3-4. Involution of a previously seen central to left-sided shallow disc herniation at L5-S1. Today, this looks like a simple disc bulge. No evidence of neural compression. Electronically Signed   By: Paulina Fusi M.D.   On: 08/27/2015 11:53   I have personally reviewed and evaluated these images and lab results as part of my medical decision-making.   EKG Interpretation None      MDM  Patient with back pain.  Neurological findings remarkable for altered sensation and S1 dermatome, this is a new finding.  Patient can walk but states is painful.  No loss of bowel or bladder control.  No concern for cauda equina.  No fever, night sweats, weight loss, h/o cancer, IVDU.  Given new neurological findings on exam will obtain MRI. MRI shows no worsening or definite cause of the presenting symptoms. Mild noncompressive disc bulge at L3-4. Shallow disc herniation at L5-S1. Pain management with IV Toradol, Decadron, Dilaudid, and Valium in the ED. SW consulted to assist patient with insurance and co-pay issues. Follow-up with orthopedics. Case has been discussed with Dr. Donnald Garre who agrees with the  above plan.   Final diagnoses:  Low back pain    I personally performed the services described in this documentation, which was scribed in my presence. The recorded information has been reviewed and is accurate.     Cheri Fowler, PA-C 08/27/15 1252  Cheri Fowler, PA-C 08/27/15 1255  Arby Barrette, MD 09/04/15 [phone]

## 2015-08-27 NOTE — ED Notes (Signed)
Declined W/C at D/C and was escorted to lobby by RN. 

## 2015-08-27 NOTE — ED Notes (Signed)
Pt eports a recent MRI and needs surgery but has no money. Pt requesting help with surgery.

## 2015-09-23 ENCOUNTER — Other Ambulatory Visit: Payer: Self-pay

## 2015-10-21 ENCOUNTER — Encounter (HOSPITAL_COMMUNITY): Payer: Self-pay | Admitting: *Deleted

## 2015-10-21 ENCOUNTER — Emergency Department (HOSPITAL_COMMUNITY)
Admission: EM | Admit: 2015-10-21 | Discharge: 2015-10-21 | Disposition: A | Discharge status: Home / Self Care | Payer: Self-pay | Attending: Emergency Medicine | Admitting: Emergency Medicine

## 2015-10-21 DIAGNOSIS — R233 Spontaneous ecchymoses: Secondary | ICD-10-CM | POA: Insufficient documentation

## 2015-10-21 DIAGNOSIS — R630 Anorexia: Secondary | ICD-10-CM | POA: Insufficient documentation

## 2015-10-21 DIAGNOSIS — B349 Viral infection, unspecified: Secondary | ICD-10-CM | POA: Insufficient documentation

## 2015-10-21 DIAGNOSIS — I10 Essential (primary) hypertension: Secondary | ICD-10-CM | POA: Insufficient documentation

## 2015-10-21 DIAGNOSIS — F17210 Nicotine dependence, cigarettes, uncomplicated: Secondary | ICD-10-CM | POA: Insufficient documentation

## 2015-10-21 DIAGNOSIS — Z8659 Personal history of other mental and behavioral disorders: Secondary | ICD-10-CM | POA: Insufficient documentation

## 2015-10-21 LAB — BASIC METABOLIC PANEL
Anion gap: 11 (ref 5–15)
BUN: 8 mg/dL (ref 6–20)
CHLORIDE: 107 mmol/L (ref 101–111)
CO2: 21 mmol/L — ABNORMAL LOW (ref 22–32)
CREATININE: 1.01 mg/dL (ref 0.61–1.24)
Calcium: 9.1 mg/dL (ref 8.9–10.3)
GFR calc Af Amer: >60 mL/min (ref >60)
GFR calc non Af Amer: >60 mL/min (ref >60)
Glucose, Bld: 98 mg/dL (ref 65–99)
Potassium: 4.2 mmol/L (ref 3.5–5.1)
SODIUM: 139 mmol/L (ref 135–145)

## 2015-10-21 LAB — CBC WITH DIFFERENTIAL/PLATELET
BASOS PCT: 0 %
Basophils Absolute: 0 10*3/uL (ref 0.0–0.1)
EOS ABS: 0.1 10*3/uL (ref 0.0–0.7)
Eosinophils Relative: 1 %
HCT: 47.9 % (ref 39.0–52.0)
HEMOGLOBIN: 16.6 g/dL (ref 13.0–17.0)
LYMPHS ABS: 6.5 10*3/uL — AB (ref 0.7–4.0)
Lymphocytes Relative: 45 %
MCH: 30.3 pg (ref 26.0–34.0)
MCHC: 34.7 g/dL (ref 30.0–36.0)
MCV: 87.6 fL (ref 78.0–100.0)
MONO ABS: 1.3 10*3/uL — AB (ref 0.1–1.0)
Monocytes Relative: 9 %
NEUTROS ABS: 6.6 10*3/uL (ref 1.7–7.7)
Neutrophils Relative %: 45 %
Platelets: 182 10*3/uL (ref 150–400)
RBC: 5.47 MIL/uL (ref 4.22–5.81)
RDW: 13 % (ref 11.5–15.5)
WBC: 14.5 10*3/uL — ABNORMAL HIGH (ref 4.0–10.5)

## 2015-10-21 MED ORDER — KETOROLAC TROMETHAMINE 15 MG/ML IJ SOLN
15.0000 mg | Freq: Once | INTRAMUSCULAR | Status: AC
  Administered 2015-10-21: 15 mg via INTRAVENOUS
  Filled 2015-10-21: qty 1

## 2015-10-21 MED ORDER — SODIUM CHLORIDE 0.9 % IV BOLUS (SEPSIS)
1000.0000 mL | Freq: Once | INTRAVENOUS | Status: AC
  Administered 2015-10-21: 1000 mL via INTRAVENOUS

## 2015-10-21 MED ORDER — ACETAMINOPHEN 325 MG PO TABS: 650.0000 mg | ORAL_TABLET | Freq: Once | ORAL | Status: DC

## 2015-10-21 MED ORDER — METOCLOPRAMIDE HCL 5 MG/ML IJ SOLN
5.0000 mg | Freq: Once | INTRAMUSCULAR | Status: AC
  Administered 2015-10-21: 5 mg via INTRAVENOUS
  Filled 2015-10-21: qty 2

## 2015-10-21 MED ORDER — ONDANSETRON HCL 4 MG/2ML IJ SOLN
4.0000 mg | Freq: Once | INTRAMUSCULAR | Status: AC
  Administered 2015-10-21: 4 mg via INTRAVENOUS
  Filled 2015-10-21: qty 2

## 2015-10-21 NOTE — ED Notes (Signed)
States sore throat x 4 days ago, 2 days ago cough, today joints feel like they are on fire, projectile vomiting and diarrhea.  Pt states just returned 10 [place] ago from [place].

## 2015-10-21 NOTE — ED Provider Notes (Signed)
CSN: [id number]     Arrival date & time 10/21/15  [id number] History   First MD Initiated Contact with Patient 10/21/15 0802     Chief Complaint  Patient presents with  . URI  . Generalized Body Aches     HPI 35 y.o. male presenting with flulike symptoms including low-grade fevers, cough, sore throat, headache, nausea, vomiting, diarrhea, myalgias, arthralgias for the last 2 days. Patient recently returned from [place] where several of his friends that he was traveling with tested positive for Zika virus. Myalgias are diffuse. Arthralgias present "in all of my joints but worse in both my knees." Headache is resolved. Vomiting was nonbloody nonbilious. Reports several episodes of semi-solid diarrhea yesterday. He endorses inability to keep any fluids down for the last 24 hours and "I feel really dehydrated." Denies chest pain, shortness of breath, abdominal pain. No neck pain or stiffness. No history of prior medical conditions.   Past Medical History  Diagnosis Date  . PTSD (post-traumatic stress disorder)   . Bipolar 1 disorder (HCC)   . Hypertension    Past Surgical History  Procedure Laterality Date  . Appendectomy    . Heel spur surgery    . Foot surgery     Family History  Problem Relation Age of Onset  . Heart disease Father   . High blood pressure Father   . Diabetes Father    Social History  Substance Use Topics  . Smoking status: Current Every Day Smoker -- 0.50 packs/day    Types: Cigarettes  . Smokeless tobacco: Never Used  . Alcohol Use: No    Review of Systems  Constitutional: Positive for fever, chills, appetite change and fatigue. Negative for activity change.  HENT: Positive for sore throat. Negative for congestion, facial swelling and rhinorrhea.   Eyes: Negative for visual disturbance.  Respiratory: Positive for cough. Negative for shortness of breath and wheezing.   Cardiovascular: Negative for chest pain, palpitations and leg swelling.   Gastrointestinal: Positive for nausea, vomiting and diarrhea. Negative for abdominal pain, constipation and blood in stool.  Genitourinary: Negative for dysuria, frequency, hematuria, flank pain and difficulty urinating.  Musculoskeletal: Positive for myalgias and arthralgias. Negative for back pain, joint swelling, neck pain and neck stiffness.  Skin: Negative for rash.  Neurological: Negative for dizziness, seizures, syncope, speech difficulty, weakness, light-headedness, numbness and headaches.  Psychiatric/Behavioral: Negative for behavioral problems, confusion and agitation.      Allergies  Review of patient's allergies indicates no known allergies.  Home Medications   Prior to Admission medications   Medication Sig Start Date End Date Taking? Authorizing Provider  acetaminophen (TYLENOL) 325 MG tablet Take 325 mg by mouth every 6 (six) hours as needed.    Historical Provider, MD  oxyCODONE-acetaminophen (PERCOCET/ROXICET) 5-325 MG tablet Take 1 tablet by mouth every 4 (four) hours as needed for severe pain. 08/27/15   Kayla Rose, PA-C   BP 128/75 mmHg  Pulse 80  Temp(Src) 99.8 F (37.7 C) (Oral)  Resp 16  Ht 5\' 10"  (1.778 m)  Wt 95.709 kg  BMI 30.28 kg/m2  SpO2 96% Physical Exam  Constitutional: He is oriented to person, place, and time. He appears well-developed and well-nourished. No distress.  HENT:  Head: Normocephalic and atraumatic.  Right Ear: External ear normal.  Left Ear: External ear normal.  Nose: Nose normal.  Mouth/Throat: Oropharynx is clear and moist. No oropharyngeal exudate.  Palatal petechiae present. No cervical lymphadenopathy. Tonsils erythematous bilaterally  Eyes: Conjunctivae and  EOM are normal. Pupils are equal, round, and reactive to light. Right eye exhibits no discharge. Left eye exhibits no discharge. No scleral icterus.  Neck: Normal range of motion. Neck supple. No tracheal deviation present.  Cardiovascular: Normal rate, regular rhythm,  normal heart sounds and intact distal pulses.  Exam reveals no gallop and no friction rub.   No murmur heard. Pulmonary/Chest: Effort normal and breath sounds normal. No respiratory distress. He has no wheezes. He has no rales. He exhibits no tenderness.  Abdominal: Soft. Bowel sounds are normal. He exhibits no distension and no mass. There is no tenderness. There is no rebound and no guarding.  Musculoskeletal: Normal range of motion. He exhibits no edema or tenderness.  Full passive and active ROM of the joints, no effusions, erythema, or stiffness  Neurological: He is alert and oriented to person, place, and time. He exhibits normal muscle tone.  Skin: Skin is warm and dry. No rash noted. He is not diaphoretic.  Psychiatric: He has a normal mood and affect. His behavior is normal. Judgment and thought content normal.    ED Course  Procedures (including critical care time) Labs Review Labs Reviewed  CBC WITH DIFFERENTIAL/PLATELET - Abnormal; Notable for the following:    WBC 14.5 (*)    Lymphs Abs 6.5 (*)    Monocytes Absolute 1.3 (*)    All other components within normal limits  BASIC METABOLIC PANEL - Abnormal; Notable for the following:    CO2 21 (*)    All other components within normal limits    Imaging Review No results found. I have personally reviewed and evaluated these images and lab results as part of my medical decision-making.   EKG Interpretation None      MDM   Final diagnoses:  Viral illness    Patient generally well-appearing and hemodynamically stable. Afebrile in the ED. Normal renal function and electrolytes. Leukocytosis to 14.5 with elevated absolute lymphocytes. Suspect an acute viral illness given the constellation of his symptoms. I doubt severe bacterial infection including strep pharyngitis, pneumonia, meningitis, intra-abdominal infection. As patient was just recently in the [place], [place] virus is on the differential. Patient counseled  regarding routes of spread of infection including unprotected sex and saliva.   Symptoms improved in the emergency department after a liter of fluid, Zofran, Reglan, and Toradol. Patient is outside the window for Tamiflu. Recommend continuing supportive care and follow-up with his PCP in 3 days should symptoms persist.    Charlie Pitter, MD 10/21/15 1104  Lyndal Pulley, MD 10/21/15 [phone]

## 2015-10-21 NOTE — Discharge Instructions (Signed)

## 2015-10-21 NOTE — ED Notes (Signed)
PA at the bedside.

## 2015-10-21 NOTE — ED Notes (Signed)
MD Knott at the bedside   

## 2015-10-22 LAB — PATHOLOGIST SMEAR REVIEW

## 2015-11-28 ENCOUNTER — Ambulatory Visit: Payer: Self-pay | Admitting: Family Medicine

## 2016-07-26 IMAGING — MR MR LUMBAR SPINE W/O CM
4 of 5 series · 19 of 48 positions shown · non-contrast
Comparison: 02/02/2015

CLINICAL DATA: Acute presentation with numbness in the toes in
chronic low back pain. Symptoms on the left.

EXAM:
MRI LUMBAR SPINE WITHOUT CONTRAST
TECHNIQUE: Multiplanar, multisequence MR imaging of the lumbar spine was
performed. No intravenous contrast was administered.

[Series 8: T1 · axial · 4.0mm · 0.39mm/px · z∈[-18,+141]mm · 3 of 44 slices shown (1 of 2)]
[im 6/44]
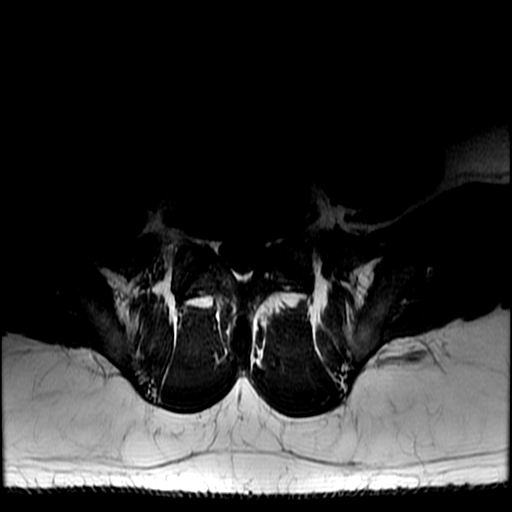
[im 22/44]
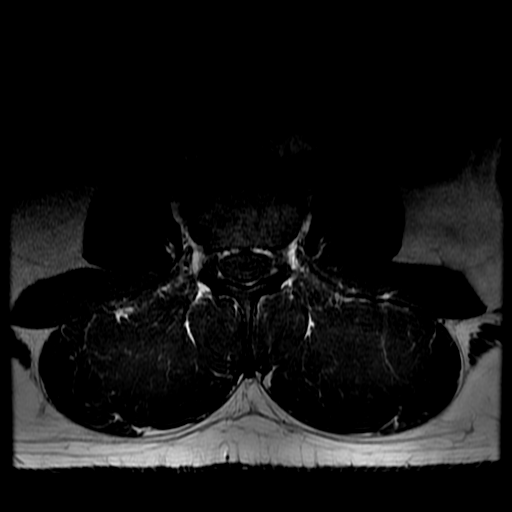
[im 38/44]
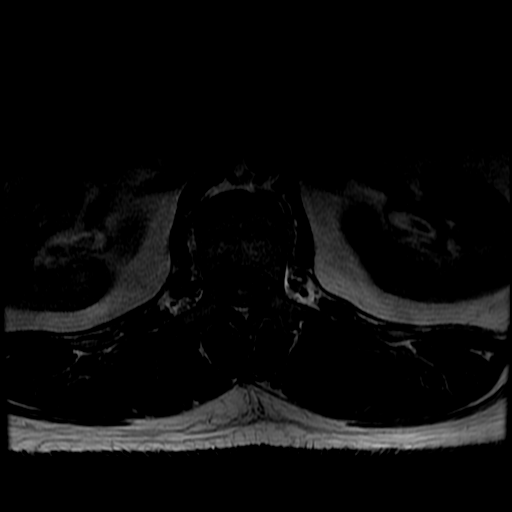

[Series 300: T2 · sagittal · 4.0mm · 0.55mm/px · 5 of 14 slices shown (1 of 2)]
[im 1/14]
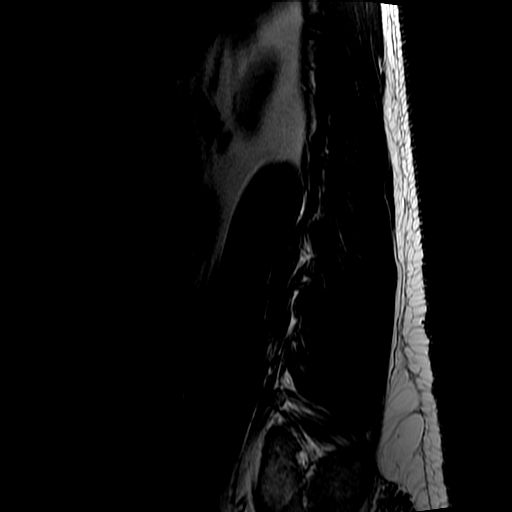
[im 4/14]
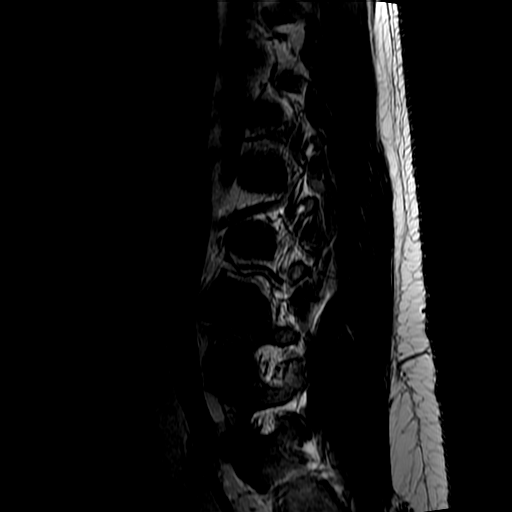
[im 7/14]
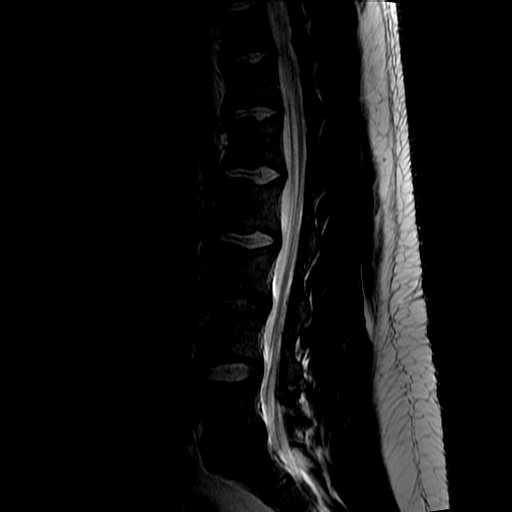
[im 10/14]
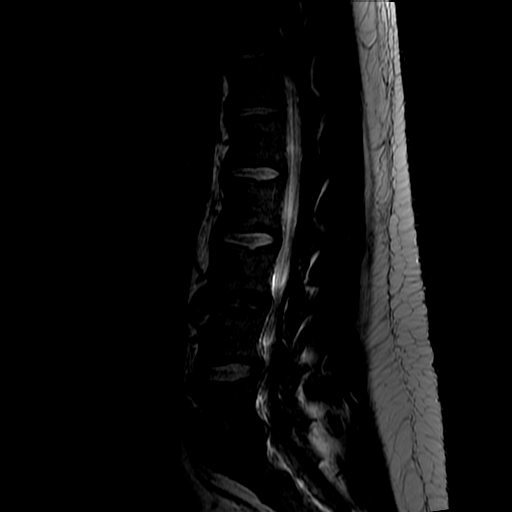
[im 14/14]
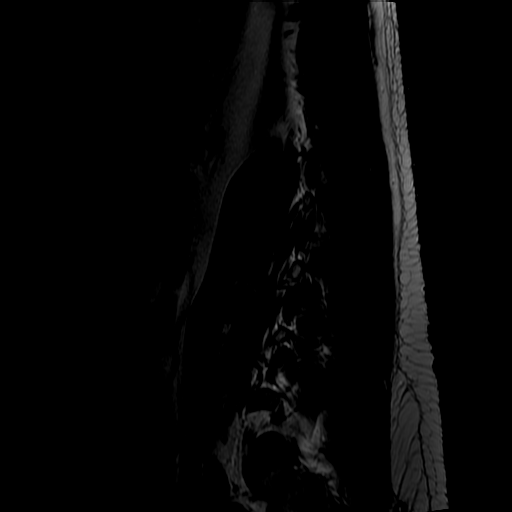

[Series 600: T1 · sagittal · 4.0mm · 0.55mm/px · 3 of 14 slices shown (2 of 2)]
[im 1/14]
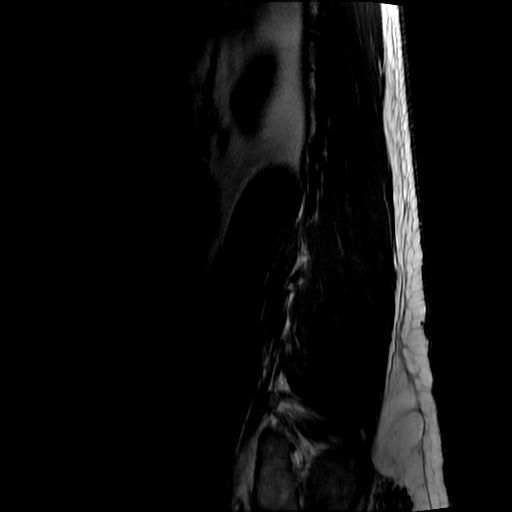
[im 7/14]
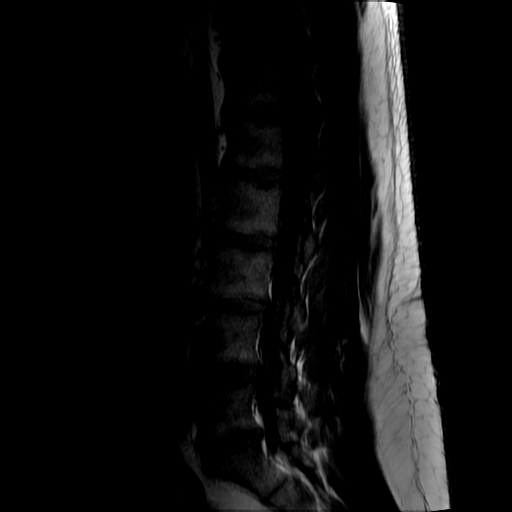
[im 14/14]
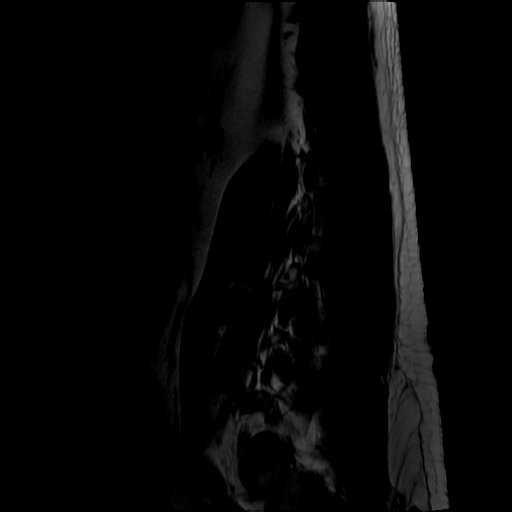

[Series 700: T2 · axial · 4.0mm · 0.39mm/px · z∈[-33,+141]mm · 8 of 44 slices shown (2 of 2)]
[im 3/44]
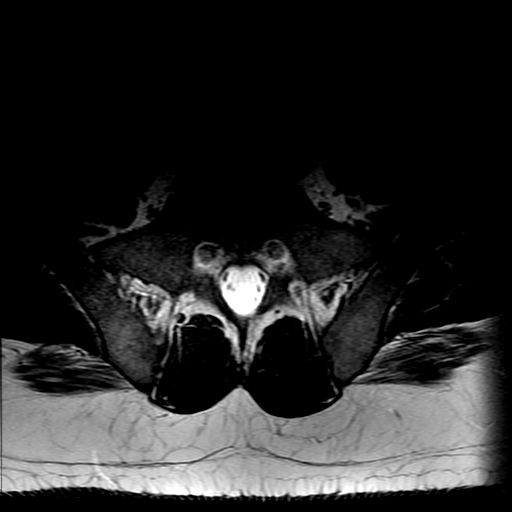
[im 6/44]
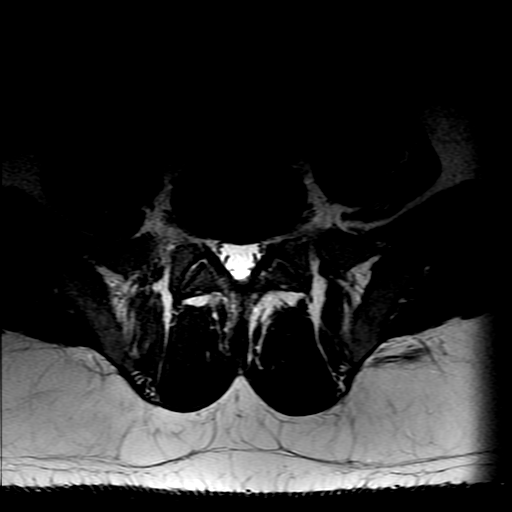
[im 9/44]
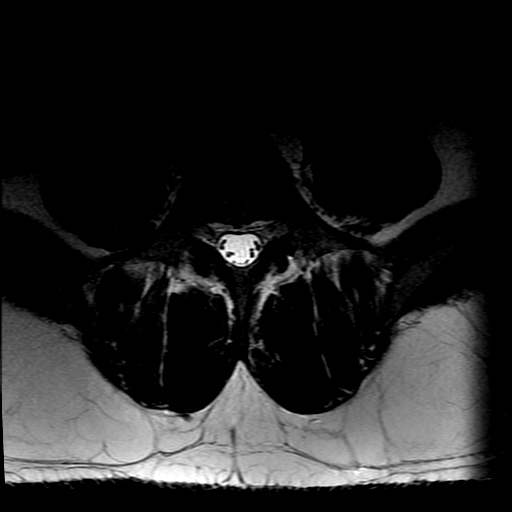
[im 15/44]
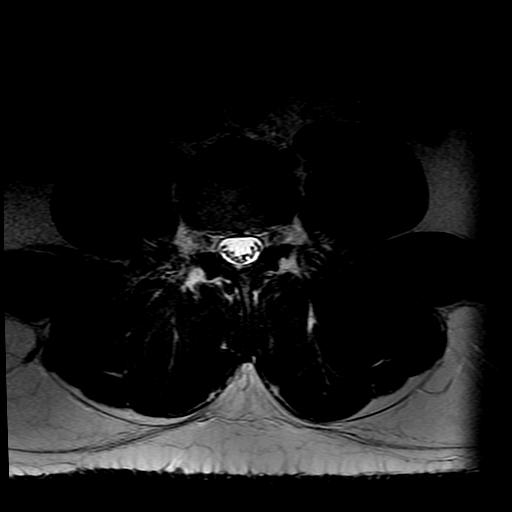
[im 21/44]
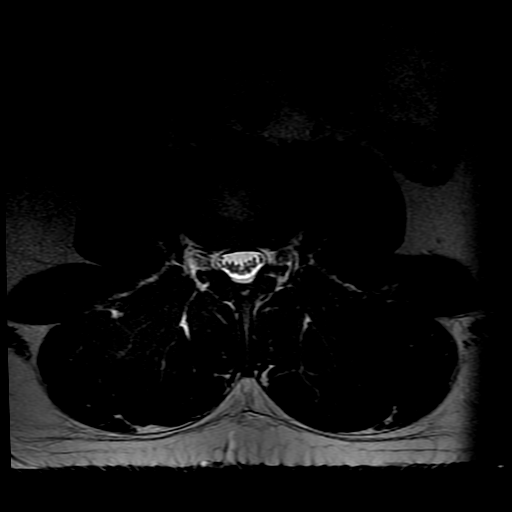
[im 23/44]
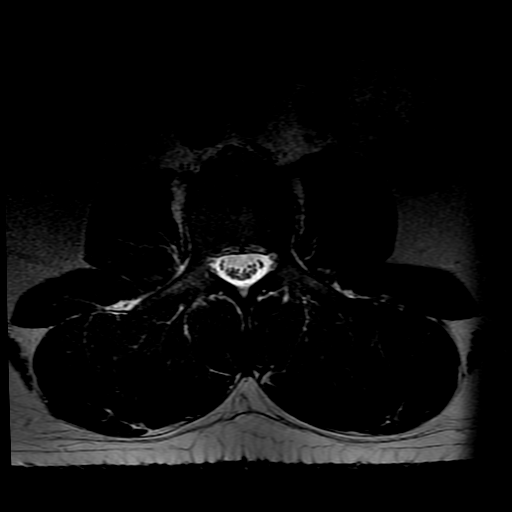
[im 26/44]
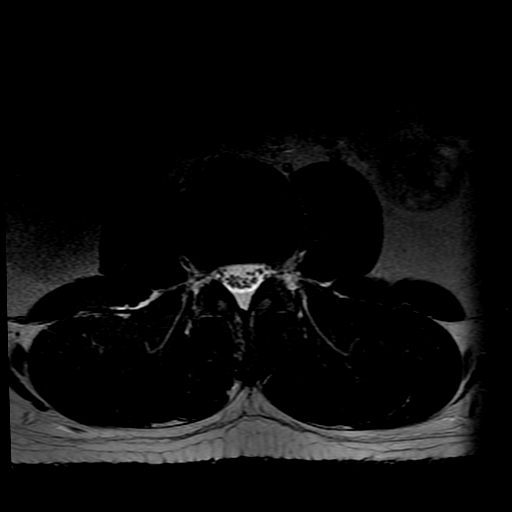
[im 38/44]
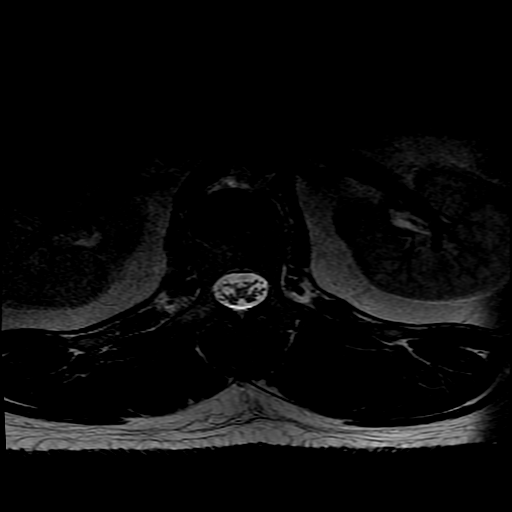

[19 of 48 positions shown; findings below may reference images not displayed]

FINDINGS: There is no abnormality at L2-3 or above. The discs are
unremarkable. The canal and foramina are widely patent. The distal
cord and conus are normal with conus tip at lower L1.

L3-4: Mild desiccation and bulging of the disc but no stenosis or
neural compression.

L4-5:  Normal interspace.

L5-S1: Desiccation and mild bulging of the disc. No stenosis or
neural compression. Minimal facet degeneration. Previously seen disc
protrusion at this level has involuted.
IMPRESSION: No worsening or definite cause of the presenting symptoms is
identified.

Mild non-compressive disc bulge at L3-4.

Involution of a previously seen central to left-sided shallow disc
herniation at L5-S1. Today, this looks like a simple disc bulge. No
evidence of neural compression.
